# Patient Record
Sex: Female | Born: 1975
Health system: Southern US, Community
[De-identification: ages and names within clinical notes are randomized; demographics above are authoritative.]

## PROBLEM LIST (undated history)

## (undated) DIAGNOSIS — S32049A Unspecified fracture of fourth lumbar vertebra, initial encounter for closed fracture: Secondary | ICD-10-CM

## (undated) DIAGNOSIS — K2970 Gastritis, unspecified, without bleeding: Secondary | ICD-10-CM

## (undated) DIAGNOSIS — C50919 Malignant neoplasm of unspecified site of unspecified female breast: Secondary | ICD-10-CM

## (undated) DIAGNOSIS — C801 Malignant (primary) neoplasm, unspecified: Secondary | ICD-10-CM

## (undated) DIAGNOSIS — L709 Acne, unspecified: Secondary | ICD-10-CM

## (undated) DIAGNOSIS — D497 Neoplasm of unspecified behavior of endocrine glands and other parts of nervous system: Secondary | ICD-10-CM

## (undated) DIAGNOSIS — Z86018 Personal history of other benign neoplasm: Secondary | ICD-10-CM

## (undated) HISTORY — PX: AUGMENTATION MAMMAPLASTY: SUR837

## (undated) HISTORY — DX: Gastritis, unspecified, without bleeding: K29.70

## (undated) HISTORY — DX: Personal history of other benign neoplasm: Z86.018

## (undated) HISTORY — DX: Acne, unspecified: L70.9

## (undated) HISTORY — PX: MASTECTOMY: SHX3

## (undated) HISTORY — PX: TONSILLECTOMY: SUR1361

## (undated) HISTORY — DX: Unspecified fracture of fourth lumbar vertebra, initial encounter for closed fracture: S32.049A

## (undated) HISTORY — DX: Neoplasm of unspecified behavior of endocrine glands and other parts of nervous system: D49.7

---

## 1999-05-01 ENCOUNTER — Other Ambulatory Visit: Admission: RE | Admit: 1999-05-01 | Discharge: 1999-05-01 | Payer: Self-pay | Admitting: Obstetrics & Gynecology

## 1999-11-12 ENCOUNTER — Inpatient Hospital Stay (HOSPITAL_COMMUNITY): Admission: AD | Admit: 1999-11-12 | Discharge: 1999-11-12 | Payer: Self-pay | Admitting: Obstetrics and Gynecology

## 2000-01-09 ENCOUNTER — Inpatient Hospital Stay (HOSPITAL_COMMUNITY): Admission: AD | Admit: 2000-01-09 | Discharge: 2000-01-11 | Payer: Self-pay | Admitting: Obstetrics & Gynecology

## 2000-02-05 ENCOUNTER — Other Ambulatory Visit: Admission: RE | Admit: 2000-02-05 | Discharge: 2000-02-05 | Payer: Self-pay | Admitting: Obstetrics & Gynecology

## 2001-02-17 ENCOUNTER — Other Ambulatory Visit: Admission: RE | Admit: 2001-02-17 | Discharge: 2001-02-17 | Payer: Self-pay | Admitting: Obstetrics & Gynecology

## 2002-03-17 ENCOUNTER — Other Ambulatory Visit: Admission: RE | Admit: 2002-03-17 | Discharge: 2002-03-17 | Payer: Self-pay | Admitting: Obstetrics & Gynecology

## 2002-10-05 ENCOUNTER — Inpatient Hospital Stay (HOSPITAL_COMMUNITY): Admission: AD | Admit: 2002-10-05 | Discharge: 2002-10-05 | Payer: Self-pay | Admitting: Obstetrics & Gynecology

## 2002-11-25 ENCOUNTER — Inpatient Hospital Stay (HOSPITAL_COMMUNITY): Admission: AD | Admit: 2002-11-25 | Discharge: 2002-11-27 | Payer: Self-pay | Admitting: Obstetrics and Gynecology

## 2002-12-28 ENCOUNTER — Other Ambulatory Visit: Admission: RE | Admit: 2002-12-28 | Discharge: 2002-12-28 | Payer: Self-pay | Admitting: Family Medicine

## 2004-03-14 ENCOUNTER — Other Ambulatory Visit: Admission: RE | Admit: 2004-03-14 | Discharge: 2004-03-14 | Payer: Self-pay | Admitting: Obstetrics & Gynecology

## 2005-03-27 ENCOUNTER — Other Ambulatory Visit: Admission: RE | Admit: 2005-03-27 | Discharge: 2005-03-27 | Payer: Self-pay | Admitting: Obstetrics & Gynecology

## 2006-03-02 DIAGNOSIS — D497 Neoplasm of unspecified behavior of endocrine glands and other parts of nervous system: Secondary | ICD-10-CM

## 2006-03-02 HISTORY — DX: Neoplasm of unspecified behavior of endocrine glands and other parts of nervous system: D49.7

## 2006-11-01 DIAGNOSIS — K2970 Gastritis, unspecified, without bleeding: Secondary | ICD-10-CM

## 2006-11-01 HISTORY — DX: Gastritis, unspecified, without bleeding: K29.70

## 2007-01-20 ENCOUNTER — Ambulatory Visit: Payer: Self-pay | Admitting: Cardiovascular Disease

## 2007-09-07 ENCOUNTER — Ambulatory Visit: Payer: Self-pay | Admitting: Unknown Physician Specialty

## 2007-10-03 ENCOUNTER — Ambulatory Visit: Payer: Self-pay | Admitting: Gastroenterology

## 2008-09-30 ENCOUNTER — Ambulatory Visit: Payer: Self-pay | Admitting: Internal Medicine

## 2009-03-27 ENCOUNTER — Encounter: Payer: Self-pay | Admitting: Orthopedic Surgery

## 2009-04-02 ENCOUNTER — Encounter: Payer: Self-pay | Admitting: Orthopedic Surgery

## 2009-04-30 ENCOUNTER — Encounter: Payer: Self-pay | Admitting: Orthopedic Surgery

## 2010-03-02 HISTORY — PX: ENDOMETRIAL ABLATION: SHX621

## 2010-06-29 ENCOUNTER — Ambulatory Visit: Payer: Self-pay | Admitting: Family Medicine

## 2010-12-17 ENCOUNTER — Other Ambulatory Visit: Payer: Self-pay | Admitting: Internal Medicine

## 2011-01-05 ENCOUNTER — Encounter: Payer: Self-pay | Admitting: Internal Medicine

## 2011-01-06 ENCOUNTER — Encounter: Payer: Self-pay | Admitting: Internal Medicine

## 2011-01-06 ENCOUNTER — Ambulatory Visit (INDEPENDENT_AMBULATORY_CARE_PROVIDER_SITE_OTHER): Payer: Managed Care, Other (non HMO) | Admitting: Internal Medicine

## 2011-01-06 DIAGNOSIS — E538 Deficiency of other specified B group vitamins: Secondary | ICD-10-CM

## 2011-01-06 DIAGNOSIS — Z124 Encounter for screening for malignant neoplasm of cervix: Secondary | ICD-10-CM

## 2011-01-06 DIAGNOSIS — D497 Neoplasm of unspecified behavior of endocrine glands and other parts of nervous system: Secondary | ICD-10-CM | POA: Insufficient documentation

## 2011-01-06 DIAGNOSIS — M940 Chondrocostal junction syndrome [Tietze]: Secondary | ICD-10-CM

## 2011-01-06 MED ORDER — CYANOCOBALAMIN 1000 MCG/ML IJ SOLN: 1000.0000 ug | INTRAMUSCULAR | Status: DC

## 2011-01-07 ENCOUNTER — Encounter: Payer: Self-pay | Admitting: Internal Medicine

## 2011-01-07 DIAGNOSIS — K2970 Gastritis, unspecified, without bleeding: Secondary | ICD-10-CM | POA: Insufficient documentation

## 2011-01-07 DIAGNOSIS — R079 Chest pain, unspecified: Secondary | ICD-10-CM | POA: Insufficient documentation

## 2011-01-07 NOTE — Progress Notes (Signed)
  Subjective:    Patient ID: Robin Riley, female    DOB: 1975/11/27, 35 y.o.   MRN: [id number]  HPI  35 yo healthy female here for her annual exam.  She has no new complaints and had her PAP smear done recently by Dr. Jennette Kettle.     Review of Systems     Objective:   Physical Exam        Assessment & Plan:   Subjective:     Robin Riley is a 35 y.o. female and is here for a comprehensive physical exam. The patient reports no problems.  History   Social History  . Marital Status: Married    Spouse Name: N/A    Number of Children: N/A  . Years of Education: N/A   Occupational History  . Not on file.   Social History Main Topics  . Smoking status: Never Smoker   . Smokeless tobacco: Never Used  . Alcohol Use: No  . Drug Use: No  . Sexually Active: Not on file   Other Topics Concern  . Not on file   Social History Narrative  . No narrative on file   Health Maintenance  Topic Date Due  . Pap Smear  12/09/1991  . Tetanus/tdap  12/08/1992  . Influenza Vaccine  12/01/2010    The following portions of the patient's history were reviewed and updated as appropriate: allergies, current medications, past family history, past medical history, past social history, past surgical history and problem list.  Review of Systems A comprehensive review of systems was negative.   Objective:    BP 128/72  Pulse 79  Temp(Src) 98.6 F (37 C) (Oral)  Resp 16  Ht 5' 3.5" (1.613 m)  Wt 137 lb 4 oz (62.256 kg)  BMI 23.93 kg/m2  SpO2 100%  LMP 12/21/2010 General appearance: alert, cooperative and appears stated age Head: Normocephalic, without obvious abnormality, atraumatic Eyes: conjunctivae/corneas clear. PERRL, EOM's intact. Fundi benign. Ears: normal TM's and external ear canals both ears Nose: Nares normal. Septum midline. Mucosa normal. No drainage or sinus tenderness. Throat: lips, mucosa, and tongue normal; teeth and gums normal Neck: no adenopathy, no carotid  bruit, no JVD, supple, symmetrical, trachea midline and thyroid not enlarged, symmetric, no tenderness/mass/nodules Back: symmetric, no curvature. ROM normal. No CVA tenderness. Lungs: clear to auscultation bilaterally Heart: regular rate and rhythm, S1, S2 normal, no murmur, click, rub or gallop Abdomen: soft, non-tender; bowel sounds normal; no masses,  no organomegaly Extremities: extremities normal, atraumatic, no cyanosis or edema Pulses: 2+ and symmetric Skin: Skin color, texture, turgor normal. No rashes or lesions Neurologic: Alert and oriented X 3, normal strength and tone. Normal symmetric reflexes. Normal coordination and gait Cranial nerves: normal Sensory: normal Motor: grossly normal Reflexes: 2+ and symmetric Coordination: normal    Assessment:    Healthy female exam. PAP smear and breast exam were done by D. Neal in August.   She has no hsitory of hyperlipidemia, diabetes, anemia or hypertension.    Plan:    Follow up in one year.  See After Visit Summary for Counseling Recommendations

## 2012-01-05 ENCOUNTER — Ambulatory Visit (INDEPENDENT_AMBULATORY_CARE_PROVIDER_SITE_OTHER): Payer: Managed Care, Other (non HMO) | Admitting: Internal Medicine

## 2012-01-05 ENCOUNTER — Encounter: Payer: Self-pay | Admitting: Internal Medicine

## 2012-01-05 VITALS — BP 110/64 | HR 72 | Temp 97.9°F | Ht 63.5 in | Wt 135.2 lb

## 2012-01-05 DIAGNOSIS — Z8781 Personal history of (healed) traumatic fracture: Secondary | ICD-10-CM | POA: Insufficient documentation

## 2012-01-05 DIAGNOSIS — E559 Vitamin D deficiency, unspecified: Secondary | ICD-10-CM

## 2012-01-05 DIAGNOSIS — IMO0002 Reserved for concepts with insufficient information to code with codable children: Secondary | ICD-10-CM

## 2012-01-05 DIAGNOSIS — Z0000 Encounter for general adult medical examination without abnormal findings: Secondary | ICD-10-CM

## 2012-01-05 DIAGNOSIS — R5381 Other malaise: Secondary | ICD-10-CM

## 2012-01-05 DIAGNOSIS — E538 Deficiency of other specified B group vitamins: Secondary | ICD-10-CM | POA: Insufficient documentation

## 2012-01-05 DIAGNOSIS — Z13220 Encounter for screening for lipoid disorders: Secondary | ICD-10-CM

## 2012-01-05 DIAGNOSIS — R5383 Other fatigue: Secondary | ICD-10-CM

## 2012-01-05 LAB — COMPREHENSIVE METABOLIC PANEL
AST: 20 U/L (ref 0–37)
Albumin: 4.2 g/dL (ref 3.5–5.2)
Alkaline Phosphatase: 51 U/L (ref 39–117)
BUN: 8 mg/dL (ref 6–23)
Glucose, Bld: 81 mg/dL (ref 70–99)
Potassium: 4.2 mEq/L (ref 3.5–5.1)
Sodium: 139 mEq/L (ref 135–145)
Total Bilirubin: 0.5 mg/dL (ref 0.3–1.2)
Total Protein: 7.2 g/dL (ref 6.0–8.3)

## 2012-01-05 LAB — LIPID PANEL
HDL: 45.8 mg/dL (ref >39.00)
LDL Cholesterol: 90 mg/dL (ref 0–99)
VLDL: 8.4 mg/dL (ref 0.0–40.0)

## 2012-01-05 LAB — TSH: TSH: 0.89 u[IU]/mL (ref 0.35–5.50)

## 2012-01-05 NOTE — Patient Instructions (Signed)
You can take up to 800 mg of ibuprofen  every 8 hours for brief periods o

## 2012-01-05 NOTE — Progress Notes (Signed)
Patient ID: Robin Riley, female   DOB: [birth year], 36 y.o.   MRN: [id number]  Subjective:     Robin Riley is a 36 y.o. female and is here for a comprehensive physical exam. Does not need PAP.  The patient reports that she continues to have chest pain , not daily ,  Finished a Z pack yesterday for sinus infection.  And had recurrence of costochondritis. Treats it with ibuprofen.  Occurs almost weekly.  Taking dexilant to protect stomach. Has had a reaction to celebrex in the past due to sulfur allergy.  She also has occasional low back pain aggravated by prlonged walking, this weekend.  Has history of vertebral fracture, lumbar, seen by Dr. Loistine Simas at [facility] .  Relieved with [facility], chiropractor.     History   Social History  . Marital Status: Married    Spouse Name: N/A    Number of Children: N/A  . Years of Education: N/A   Occupational History  . Not on file.   Social History Main Topics  . Smoking status: Never Smoker   . Smokeless tobacco: Never Used  . Alcohol Use: No  . Drug Use: No  . Sexually Active: Not on file   Other Topics Concern  . Not on file   Social History Narrative  . No narrative on file   Health Maintenance  Topic Date Due  . Tetanus/tdap  12/08/1992  . Influenza Vaccine  11/01/2011  . Pap Smear  07/05/2014    The following portions of the patient's history were reviewed and updated as appropriate: allergies, current medications, past family history, past medical history, past social history, past surgical history and problem list.  Review of Systems A comprehensive review of systems was negative.   Objective:   BP 110/64  Pulse 72  Temp 97.9 F (36.6 C) (Oral)  Ht 5' 3.5" (1.613 m)  Wt 135 lb 4 oz (61.349 kg)  BMI 23.58 kg/m2  SpO2 98%  LMP 12/05/2011  General appearance: alert, cooperative and appears stated age Ears: normal TM's and external ear canals both ears Throat: lips, mucosa, and tongue normal; teeth and gums normal Neck: no  adenopathy, no carotid bruit, supple, symmetrical, trachea midline and thyroid not enlarged, symmetric, no tenderness/mass/nodules Back: symmetric, no curvature. ROM normal. No CVA tenderness. Lungs: clear to auscultation bilaterally Breasts: breasts appear normal, no suspicious masses, no skin or nipple changes or axillary nodes. Heart: regular rate and rhythm, S1, S2 normal, no murmur, click, rub or gallop Abdomen: soft, non-tender; bowel sounds normal; no masses,  no organomegaly Pulses: 2+ and symmetric Skin: Skin color, texture, turgor normal. No rashes or lesions Lymph nodes: Cervical, supraclavicular, and axillary nodes normal.      Assessment:   Routine general medical examination at a health care facility Breast exam without pelvic was done and normal.    Updated Medication List Outpatient Encounter Prescriptions as of 01/05/2012  Medication Sig Dispense Refill  . calcium citrate-vitamin D 200-200 MG-UNIT TABS Take 1 tablet by mouth daily.      . cyanocobalamin (,VITAMIN B-12,) 1000 MCG/ML injection Inject 1 mL (1,000 mcg total) into the muscle every 30 (thirty) days.  12 mL  1  . dexlansoprazole (DEXILANT) 60 MG capsule Take 60 mg by mouth daily.        . fish oil-omega-3 fatty acids 1000 MG capsule Take 2 g by mouth daily.        Marland Kitchen ibuprofen (ADVIL,MOTRIN) 200 MG tablet Take 200 mg  by mouth as needed.        . Multiple Vitamin (MULTIVITAMIN) tablet Take 1 tablet by mouth daily.         .frm.

## 2012-01-06 ENCOUNTER — Telehealth: Payer: Self-pay | Admitting: Internal Medicine

## 2012-01-06 LAB — VITAMIN D 25 HYDROXY (VIT D DEFICIENCY, FRACTURES): Vit D, 25-Hydroxy: 40 ng/mL (ref 30–89)

## 2012-01-06 MED ORDER — MELOXICAM 15 MG PO TABS: 15.0000 mg | ORAL_TABLET | Freq: Every day | ORAL | Status: DC

## 2012-01-06 NOTE — Telephone Encounter (Signed)
Pt said her and Dr. Darrick Huntsman discussed trying Meloxicam and she was wanting to know if that could be called in for her to try. [facility] uses [facility] in [place]

## 2012-01-06 NOTE — Telephone Encounter (Signed)
30 day supply sent in,  It it helps, call for refills.

## 2012-01-07 ENCOUNTER — Telehealth: Payer: Self-pay | Admitting: Internal Medicine

## 2012-01-07 NOTE — Telephone Encounter (Signed)
Spoke to patient via phone gave her normal lab results and advised Rx melaxicam was [facility] to [facility]

## 2012-01-07 NOTE — Telephone Encounter (Signed)
Her cholesterol thyroid and vitamin D level were all fine.

## 2012-01-10 DIAGNOSIS — Z0000 Encounter for general adult medical examination without abnormal findings: Secondary | ICD-10-CM | POA: Insufficient documentation

## 2012-01-10 NOTE — Assessment & Plan Note (Signed)
Breast exam without pelvic was done and normal.

## 2012-01-13 ENCOUNTER — Other Ambulatory Visit: Payer: Self-pay

## 2012-01-13 DIAGNOSIS — E538 Deficiency of other specified B group vitamins: Secondary | ICD-10-CM

## 2012-01-13 NOTE — Telephone Encounter (Signed)
Refill request for Cyanocobalamin 1000 mcg ok to refill ?

## 2012-01-15 ENCOUNTER — Other Ambulatory Visit: Payer: Self-pay

## 2012-01-15 DIAGNOSIS — E538 Deficiency of other specified B group vitamins: Secondary | ICD-10-CM

## 2012-01-15 MED ORDER — CYANOCOBALAMIN 1000 MCG/ML IJ SOLN: 1000.0000 ug | INTRAMUSCULAR | Status: DC

## 2012-01-15 NOTE — Telephone Encounter (Signed)
Refill request for B-12.ok to refill?

## 2012-01-15 NOTE — Telephone Encounter (Signed)
Ok to refill,  Authorized in epic 

## 2012-01-15 NOTE — Telephone Encounter (Signed)
Cyanocobalamin 1000 mcg sent electronic to [facility]

## 2012-02-07 ENCOUNTER — Other Ambulatory Visit: Payer: Self-pay | Admitting: Internal Medicine

## 2012-03-25 ENCOUNTER — Telehealth: Payer: Self-pay | Admitting: Internal Medicine

## 2012-03-25 ENCOUNTER — Other Ambulatory Visit: Payer: Self-pay | Admitting: Internal Medicine

## 2012-03-25 NOTE — Telephone Encounter (Signed)
Pt called she needs copy of her immunization records.  Pt needs to pick this up on Monday

## 2012-03-25 NOTE — Telephone Encounter (Signed)
Med filled.  

## 2012-03-28 ENCOUNTER — Telehealth: Payer: Self-pay | Admitting: Internal Medicine

## 2012-03-28 NOTE — Telephone Encounter (Signed)
Pt notified that we do not have immunization record on file.

## 2012-03-28 NOTE — Telephone Encounter (Signed)
Pt states she needs copy of immunization records. Pt states she has left two messages with the nurse and has not heard back yet.  Pt has appt 8am 1/28 and needs the records for the appt.

## 2012-03-28 NOTE — Telephone Encounter (Signed)
We do not have any immunizations on file.

## 2012-06-09 ENCOUNTER — Other Ambulatory Visit: Payer: Self-pay | Admitting: Internal Medicine

## 2012-06-10 NOTE — Telephone Encounter (Signed)
Med filled on 4/10

## 2012-11-07 LAB — HM PAP SMEAR: HM Pap smear: NORMAL

## 2013-01-03 ENCOUNTER — Ambulatory Visit: Payer: Self-pay | Admitting: Internal Medicine

## 2013-01-05 ENCOUNTER — Other Ambulatory Visit: Payer: Self-pay

## 2013-01-05 ENCOUNTER — Encounter: Payer: Managed Care, Other (non HMO) | Admitting: Internal Medicine

## 2013-01-05 ENCOUNTER — Encounter: Payer: Self-pay | Admitting: Internal Medicine

## 2013-01-05 ENCOUNTER — Ambulatory Visit (INDEPENDENT_AMBULATORY_CARE_PROVIDER_SITE_OTHER): Payer: Managed Care, Other (non HMO) | Admitting: Internal Medicine

## 2013-01-05 VITALS — BP 110/84 | HR 82 | Temp 98.2°F | Ht 63.5 in | Wt 141.8 lb

## 2013-01-05 DIAGNOSIS — IMO0002 Reserved for concepts with insufficient information to code with codable children: Secondary | ICD-10-CM

## 2013-01-05 DIAGNOSIS — Z124 Encounter for screening for malignant neoplasm of cervix: Secondary | ICD-10-CM

## 2013-01-05 DIAGNOSIS — Z0000 Encounter for general adult medical examination without abnormal findings: Secondary | ICD-10-CM

## 2013-01-05 DIAGNOSIS — M940 Chondrocostal junction syndrome [Tietze]: Secondary | ICD-10-CM

## 2013-01-05 DIAGNOSIS — E559 Vitamin D deficiency, unspecified: Secondary | ICD-10-CM

## 2013-01-05 DIAGNOSIS — S32049S Unspecified fracture of fourth lumbar vertebra, sequela: Secondary | ICD-10-CM

## 2013-01-05 DIAGNOSIS — E785 Hyperlipidemia, unspecified: Secondary | ICD-10-CM

## 2013-01-05 DIAGNOSIS — R5381 Other malaise: Secondary | ICD-10-CM

## 2013-01-05 DIAGNOSIS — Z8262 Family history of osteoporosis: Secondary | ICD-10-CM

## 2013-01-05 LAB — CBC WITH DIFFERENTIAL/PLATELET
Basophils Absolute: 0 10*3/uL (ref 0.0–0.1)
Eosinophils Absolute: 0 10*3/uL (ref 0.0–0.7)
HCT: 37.3 % (ref 36.0–46.0)
Lymphs Abs: 1.5 10*3/uL (ref 0.7–4.0)
MCHC: 32.9 g/dL (ref 30.0–36.0)
MCV: 83.1 fl (ref 78.0–100.0)
Monocytes Absolute: 0.3 10*3/uL (ref 0.1–1.0)
Neutrophils Relative %: 61.5 % (ref 43.0–77.0)
Platelets: 200 10*3/uL (ref 150.0–400.0)
RBC: 4.49 Mil/uL (ref 3.87–5.11)
RDW: 13.5 % (ref 11.5–14.6)

## 2013-01-05 LAB — TSH: TSH: 0.71 u[IU]/mL (ref 0.35–5.50)

## 2013-01-05 LAB — COMPREHENSIVE METABOLIC PANEL
ALT: 20 U/L (ref 0–35)
AST: 20 U/L (ref 0–37)
Alkaline Phosphatase: 51 U/L (ref 39–117)
Sodium: 139 mEq/L (ref 135–145)
Total Bilirubin: 0.5 mg/dL (ref 0.3–1.2)
Total Protein: 7.1 g/dL (ref 6.0–8.3)

## 2013-01-05 LAB — LIPID PANEL: VLDL: 6.2 mg/dL (ref 0.0–40.0)

## 2013-01-05 NOTE — Patient Instructions (Signed)
DEXA scan to be ordered  PT consultation for back pain

## 2013-01-05 NOTE — Progress Notes (Signed)
Patient ID: Robin Riley, female   DOB: [birth year], 37 y.o.   MRN: [id number]  Subjective:     Robin Riley is a 37 y.o. female here for a routine exam.  Current complaints: as follows . She has had a recent flare of costochondritis , and has managed it with ibuprofen.  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    ,  Back pain is still present by managed by [employer].  She was treated with abx by ENT 2 months ago for sinusitis .  Her symptoms resolved and she had no antibiotic associated diarrhea .  Personal health questionnaire reviewed: yes.   Gynecologic History No LMP recorded. Contraception: tubal ligation and endometrial ablation and NovaSure Last Pap: Sept 2014. Results were: normal Last mammogram: at age 82. Results were: normal  Obstetric History OB History  No data available     The following portions of the patient's history were reviewed and updated as appropriate: allergies, current medications, past family history, past medical history, past social history, past surgical history and problem list.  Review of Systems A comprehensive review of systems was negative except for: Cardiovascular: positive for chest pain    Objective:   General Appearance:    Alert, cooperative, no distress, appears stated age  Head:    Normocephalic, without obvious abnormality, atraumatic  Eyes:    PERRL, conjunctiva/corneas clear, EOM's intact, fundi    benign, both eyes  Ears:    Normal TM's and external ear canals, both ears  Nose:   Nares normal, septum midline, mucosa normal, no drainage    or sinus tenderness  Throat:   Lips, mucosa, and tongue normal; teeth and gums normal  Neck:   Supple, symmetrical, trachea midline, no adenopathy;    thyroid:  no enlargement/tenderness/nodules; no carotid   bruit or JVD  Back:     Symmetric, no curvature, ROM normal, no CVA tenderness  Lungs:     Clear to auscultation bilaterally, respirations  unlabored  Chest Wall:    No tenderness or deformity   Heart:    Regular rate and rhythm, S1 and S2 normal, no murmur, rub   or gallop     Abdomen:     Soft, non-tender, bowel sounds active all four quadrants,    no masses, no organomegaly     Extremities:   Extremities normal, atraumatic, no cyanosis or edema  Pulses:   2+ and symmetric all extremities  Skin:   Skin color, texture, turgor normal, no rashes or lesions  Lymph nodes:   Cervical, supraclavicular, and axillary nodes normal  Neurologic:   CNII-XII intact, normal strength, sensation and reflexes    throughout    Assessment and Plan:  History of vertebral fracture Non traumatic, reportedly,  With no priro workup for osteoporosis.,  NO prior DEXA.  Ordered,  Along with Vit d which was low normal  Routine general medical examination at a health care facility Annual comprehensive exam was done excluding breast, pelvic and PAP smear. All screenings have been addressed .   Costochondritis Recurrent .  Managed with ibuprofen,  prior cardiologic workup noted.   Screening for cervical cancer Managed by Dr. Druscilla Brownie in [place] with annual PAP smears.    Updated Medication List Outpatient Encounter Prescriptions as of 01/05/2013  Medication Sig  . calcium citrate-vitamin D 200-200 MG-UNIT TABS Take 1 tablet by mouth daily.  . cyanocobalamin (,VITAMIN B-12,) 1000 MCG/ML injection Inject 1  mL (1,000 mcg total) into the muscle every 30 (thirty) days.  Marland Kitchen dexlansoprazole (DEXILANT) 60 MG capsule Take 60 mg by mouth daily.    . fish oil-omega-3 fatty acids 1000 MG capsule Take 2 g by mouth daily.    Marland Kitchen ibuprofen (ADVIL,MOTRIN) 200 MG tablet Take 200 mg by mouth as needed.    . meloxicam (MOBIC) 15 MG tablet TAKE 1 TABLET (15 MG TOTAL) BY MOUTH DAILY.  . Multiple Vitamin (MULTIVITAMIN) tablet Take 1 tablet by mouth daily.

## 2013-01-05 NOTE — Progress Notes (Signed)
Pre visit review using our clinic review tool, if applicable. No additional management support is needed unless otherwise documented below in the visit note. 

## 2013-01-07 NOTE — Assessment & Plan Note (Signed)
Non traumatic, reportedly,  With no priro workup for osteoporosis.,  NO prior DEXA.  Ordered,  Along with Vit d which was low normal

## 2013-01-07 NOTE — Assessment & Plan Note (Signed)
Recurrent .  Managed with ibuprofen,  prior cardiologic workup noted.

## 2013-01-07 NOTE — Assessment & Plan Note (Signed)
Annual comprehensive exam was done excluding breast, pelvic and PAP smear. All screenings have been addressed .  

## 2013-01-07 NOTE — Assessment & Plan Note (Signed)
Managed by Dr. Druscilla Brownie in [place] with annual PAP smears.

## 2013-01-10 ENCOUNTER — Encounter: Payer: Managed Care, Other (non HMO) | Admitting: Internal Medicine

## 2013-04-26 ENCOUNTER — Encounter: Payer: Self-pay | Admitting: Cardiovascular Disease

## 2014-01-12 ENCOUNTER — Other Ambulatory Visit: Payer: Self-pay | Admitting: Obstetrics & Gynecology

## 2014-01-12 DIAGNOSIS — R928 Other abnormal and inconclusive findings on diagnostic imaging of breast: Secondary | ICD-10-CM

## 2014-01-18 ENCOUNTER — Ambulatory Visit (INDEPENDENT_AMBULATORY_CARE_PROVIDER_SITE_OTHER): Payer: Managed Care, Other (non HMO) | Admitting: Internal Medicine

## 2014-01-18 ENCOUNTER — Encounter: Payer: Self-pay | Admitting: Internal Medicine

## 2014-01-18 VITALS — BP 104/70 | HR 79 | Temp 98.3°F | Resp 16 | Ht 63.0 in | Wt 141.8 lb

## 2014-01-18 DIAGNOSIS — E538 Deficiency of other specified B group vitamins: Secondary | ICD-10-CM

## 2014-01-18 DIAGNOSIS — M940 Chondrocostal junction syndrome [Tietze]: Secondary | ICD-10-CM

## 2014-01-18 DIAGNOSIS — Z0000 Encounter for general adult medical examination without abnormal findings: Secondary | ICD-10-CM

## 2014-01-18 MED ORDER — SYRINGE (DISPOSABLE) 1 ML MISC: Status: DC

## 2014-01-18 MED ORDER — CYANOCOBALAMIN 1000 MCG/ML IJ SOLN: 1000.0000 ug | INTRAMUSCULAR | Status: DC

## 2014-01-18 NOTE — Patient Instructions (Signed)

## 2014-01-18 NOTE — Progress Notes (Signed)
Pre-visit discussion using our clinic review tool. No additional management support is needed unless otherwise documented below in the visit note.  

## 2014-01-18 NOTE — Progress Notes (Signed)
Patient ID: Robin Riley, female   DOB: February 10, 1976, 38 y.o.   MRN: [id number]  Subjective:     Robin Riley is a 38 y.o. female and is here for a comprehensive physical exam. The patient reports no problems.  History   Social History  . Marital Status: Married    Spouse Name: N/A    Number of Children: N/A  . Years of Education: N/A   Occupational History  . Not on file.   Social History Main Topics  . Smoking status: Never Smoker   . Smokeless tobacco: Never Used  . Alcohol Use: No  . Drug Use: No  . Sexual Activity: Not on file   Other Topics Concern  . Not on file   Social History Narrative   Health Maintenance  Topic Date Due  . Samul Dada  12/08/1992  . INFLUENZA VACCINE  09/30/2013  . PAP SMEAR  11/08/2015    The following portions of the patient's history were reviewed and updated as appropriate: allergies, current medications, past family history, past medical history, past social history, past surgical history and problem list.  Review of Systems A comprehensive review of systems was negative.   Objective:   BP 104/70 mmHg  Pulse 79  Temp(Src) 98.3 F (36.8 C) (Oral)  Resp 16  Ht 5\' 3"  (1.6 m)  Wt 141 lb 12 oz (64.297 kg)  BMI 25.12 kg/m2  SpO2 99%  LMP 01/12/2014 (Approximate)  General appearance: alert, cooperative and appears stated age Head: Normocephalic, without obvious abnormality, atraumatic Eyes: conjunctivae/corneas clear. PERRL, EOM's intact. Fundi benign. Ears: normal TM's and external ear canals both ears Nose: Nares normal. Septum midline. Mucosa normal. No drainage or sinus tenderness. Throat: lips, mucosa, and tongue normal; teeth and gums normal Neck: no adenopathy, no carotid bruit, no JVD, supple, symmetrical, trachea midline and thyroid not enlarged, symmetric, no tenderness/mass/nodules Lungs: clear to auscultation bilaterally Breasts: normal appearance, no masses or tenderness Heart: regular rate and rhythm, S1, S2  normal, no murmur, click, rub or gallop Abdomen: soft, non-tender; bowel sounds normal; no masses,  no organomegaly Extremities: extremities normal, atraumatic, no cyanosis or edema Pulses: 2+ and symmetric Skin: Skin color, texture, turgor normal. No rashes or lesions Neurologic: Alert and oriented X 3, normal strength and tone. Normal symmetric reflexes. Normal coordination and gait.   .    Assessment and Plan:   Encounter for preventive health examination Annual wellness  exam was done as well as a comprehensive physical exam and management of acute and chronic conditions .  During the course of the visit the patient was educated and counseled about appropriate screening and preventive services including :  diabetes screening, lipid analysis with projected  10 year  risk for CAD , nutrition counseling, colorectal cancer screening, and recommended immunizations.  Printed recommendations for health maintenance screenings was given.   Costochondritis Recurrent,  Managed with prn ibuprofen.   Updated Medication List Outpatient Encounter Prescriptions as of 01/18/2014  Medication Sig  . calcium citrate-vitamin D 200-200 MG-UNIT TABS Take 1 tablet by mouth daily.  . cyanocobalamin (,VITAMIN B-12,) 1000 MCG/ML injection Inject 1 mL (1,000 mcg total) into the muscle every 30 (thirty) days.  Marland Kitchen dexlansoprazole (DEXILANT) 60 MG capsule Take 60 mg by mouth daily.    Marland Kitchen ibuprofen (ADVIL,MOTRIN) 200 MG tablet Take 200 mg by mouth as needed.    . Multiple Vitamin (MULTIVITAMIN) tablet Take 1 tablet by mouth daily.    . [DISCONTINUED] cyanocobalamin (,VITAMIN B-12,) 1000 MCG/ML  injection Inject 1 mL (1,000 mcg total) into the muscle every 30 (thirty) days.  . fish oil-omega-3 fatty acids 1000 MG capsule Take 2 g by mouth daily.    . meloxicam (MOBIC) 15 MG tablet TAKE 1 TABLET (15 MG TOTAL) BY MOUTH DAILY.  Marland Kitchen Syringe, Disposable, 1 ML MISC Use monthly for B12 injections

## 2014-01-21 NOTE — Assessment & Plan Note (Signed)
Recurrent,  Managed with prn ibuprofen.

## 2014-01-21 NOTE — Assessment & Plan Note (Signed)

## 2014-01-30 ENCOUNTER — Ambulatory Visit
Admission: RE | Admit: 2014-01-30 | Discharge: 2014-01-30 | Disposition: A | Discharge status: Home / Self Care | Payer: Managed Care, Other (non HMO) | Source: Ambulatory Visit | Attending: Obstetrics & Gynecology | Admitting: Obstetrics & Gynecology

## 2014-01-30 DIAGNOSIS — R928 Other abnormal and inconclusive findings on diagnostic imaging of breast: Secondary | ICD-10-CM

## 2014-04-09 ENCOUNTER — Encounter: Payer: Self-pay | Admitting: Internal Medicine

## 2014-04-09 MED ORDER — DEXLANSOPRAZOLE 60 MG PO CPDR: 60.0000 mg | DELAYED_RELEASE_CAPSULE | Freq: Every day | ORAL | Status: DC

## 2014-05-29 ENCOUNTER — Other Ambulatory Visit: Payer: Self-pay | Admitting: Obstetrics & Gynecology

## 2014-05-29 DIAGNOSIS — R921 Mammographic calcification found on diagnostic imaging of breast: Secondary | ICD-10-CM

## 2014-08-01 ENCOUNTER — Other Ambulatory Visit: Payer: Self-pay | Admitting: Obstetrics & Gynecology

## 2014-08-01 ENCOUNTER — Ambulatory Visit
Admission: RE | Admit: 2014-08-01 | Discharge: 2014-08-01 | Disposition: A | Discharge status: Home / Self Care | Payer: BLUE CROSS/BLUE SHIELD | Source: Ambulatory Visit | Attending: Obstetrics & Gynecology | Admitting: Obstetrics & Gynecology

## 2014-08-01 DIAGNOSIS — R921 Mammographic calcification found on diagnostic imaging of breast: Secondary | ICD-10-CM

## 2014-08-08 ENCOUNTER — Ambulatory Visit
Admission: RE | Admit: 2014-08-08 | Discharge: 2014-08-08 | Disposition: A | Discharge status: Home / Self Care | Payer: BLUE CROSS/BLUE SHIELD | Source: Ambulatory Visit | Attending: Obstetrics & Gynecology | Admitting: Obstetrics & Gynecology

## 2014-08-08 DIAGNOSIS — R921 Mammographic calcification found on diagnostic imaging of breast: Secondary | ICD-10-CM

## 2014-08-09 ENCOUNTER — Other Ambulatory Visit: Payer: Self-pay | Admitting: Obstetrics & Gynecology

## 2014-08-09 DIAGNOSIS — R921 Mammographic calcification found on diagnostic imaging of breast: Secondary | ICD-10-CM

## 2014-08-15 ENCOUNTER — Ambulatory Visit
Admission: RE | Admit: 2014-08-15 | Discharge: 2014-08-15 | Disposition: A | Discharge status: Home / Self Care | Payer: BLUE CROSS/BLUE SHIELD | Source: Ambulatory Visit | Attending: Obstetrics & Gynecology | Admitting: Obstetrics & Gynecology

## 2014-08-15 DIAGNOSIS — R921 Mammographic calcification found on diagnostic imaging of breast: Secondary | ICD-10-CM

## 2014-08-29 ENCOUNTER — Other Ambulatory Visit: Payer: Self-pay | Admitting: General Surgery

## 2014-08-29 DIAGNOSIS — N6092 Unspecified benign mammary dysplasia of left breast: Secondary | ICD-10-CM

## 2014-08-31 ENCOUNTER — Other Ambulatory Visit: Payer: Self-pay | Admitting: General Surgery

## 2014-08-31 DIAGNOSIS — N6092 Unspecified benign mammary dysplasia of left breast: Secondary | ICD-10-CM

## 2014-09-27 ENCOUNTER — Encounter (HOSPITAL_BASED_OUTPATIENT_CLINIC_OR_DEPARTMENT_OTHER): Payer: Self-pay | Admitting: *Deleted

## 2014-10-01 ENCOUNTER — Ambulatory Visit
Admission: RE | Admit: 2014-10-01 | Discharge: 2014-10-01 | Disposition: A | Discharge status: Home / Self Care | Payer: BLUE CROSS/BLUE SHIELD | Source: Ambulatory Visit | Attending: General Surgery | Admitting: General Surgery

## 2014-10-01 DIAGNOSIS — N6092 Unspecified benign mammary dysplasia of left breast: Secondary | ICD-10-CM

## 2014-10-04 ENCOUNTER — Ambulatory Visit (HOSPITAL_BASED_OUTPATIENT_CLINIC_OR_DEPARTMENT_OTHER): Payer: BLUE CROSS/BLUE SHIELD | Admitting: Anesthesiology

## 2014-10-04 ENCOUNTER — Ambulatory Visit (HOSPITAL_BASED_OUTPATIENT_CLINIC_OR_DEPARTMENT_OTHER)
Admission: RE | Admit: 2014-10-04 | Discharge: 2014-10-04 | Disposition: A | Discharge status: Home / Self Care | Payer: BLUE CROSS/BLUE SHIELD | Source: Ambulatory Visit | Attending: General Surgery | Admitting: General Surgery

## 2014-10-04 ENCOUNTER — Encounter (HOSPITAL_BASED_OUTPATIENT_CLINIC_OR_DEPARTMENT_OTHER)
Admission: RE | Disposition: A | Discharge status: Home / Self Care | Payer: Self-pay | Source: Ambulatory Visit | Attending: General Surgery

## 2014-10-04 ENCOUNTER — Ambulatory Visit
Admission: RE | Admit: 2014-10-04 | Discharge: 2014-10-04 | Disposition: A | Discharge status: Home / Self Care | Payer: BLUE CROSS/BLUE SHIELD | Source: Ambulatory Visit | Attending: General Surgery | Admitting: General Surgery

## 2014-10-04 ENCOUNTER — Encounter (HOSPITAL_BASED_OUTPATIENT_CLINIC_OR_DEPARTMENT_OTHER): Payer: Self-pay | Admitting: *Deleted

## 2014-10-04 DIAGNOSIS — N6092 Unspecified benign mammary dysplasia of left breast: Secondary | ICD-10-CM | POA: Diagnosis present

## 2014-10-04 HISTORY — PX: BREAST LUMPECTOMY WITH RADIOACTIVE SEED LOCALIZATION: SHX6424

## 2014-10-04 SURGERY — BREAST LUMPECTOMY WITH RADIOACTIVE SEED LOCALIZATION
Anesthesia: General | Site: Breast | Laterality: Left

## 2014-10-04 MED ORDER — BUPIVACAINE HCL (PF) 0.25 % IJ SOLN
INTRAMUSCULAR | Status: DC | PRN
  Administered 2014-10-04: 13 mL

## 2014-10-04 MED ORDER — BUPIVACAINE HCL (PF) 0.25 % IJ SOLN
INTRAMUSCULAR | Status: AC
  Filled 2014-10-04: qty 30

## 2014-10-04 MED ORDER — FENTANYL CITRATE (PF) 100 MCG/2ML IJ SOLN
INTRAMUSCULAR | Status: AC
  Filled 2014-10-04: qty 6

## 2014-10-04 MED ORDER — PROPOFOL 10 MG/ML IV BOLUS
INTRAVENOUS | Status: DC | PRN
  Administered 2014-10-04: 200 mg via INTRAVENOUS

## 2014-10-04 MED ORDER — MIDAZOLAM HCL 2 MG/2ML IJ SOLN
INTRAMUSCULAR | Status: AC
  Filled 2014-10-04: qty 2

## 2014-10-04 MED ORDER — CEFAZOLIN SODIUM-DEXTROSE 2-3 GM-% IV SOLR
INTRAVENOUS | Status: AC
  Filled 2014-10-04: qty 50

## 2014-10-04 MED ORDER — GLYCOPYRROLATE 0.2 MG/ML IJ SOLN: 0.2000 mg | Freq: Once | INTRAMUSCULAR | Status: DC | PRN

## 2014-10-04 MED ORDER — OXYCODONE-ACETAMINOPHEN 5-325 MG PO TABS: 1.0000 | ORAL_TABLET | ORAL | Status: DC | PRN

## 2014-10-04 MED ORDER — LACTATED RINGERS IV SOLN
INTRAVENOUS | Status: DC
  Administered 2014-10-04 (×3): via INTRAVENOUS

## 2014-10-04 MED ORDER — OXYCODONE HCL 5 MG PO TABS: 5.0000 mg | ORAL_TABLET | Freq: Once | ORAL | Status: DC | PRN

## 2014-10-04 MED ORDER — SCOPOLAMINE 1 MG/3DAYS TD PT72
1.0000 | MEDICATED_PATCH | Freq: Once | TRANSDERMAL | Status: AC | PRN
  Administered 2014-10-04: 1.5 mg via TRANSDERMAL
  Administered 2014-10-04: 1 via TRANSDERMAL

## 2014-10-04 MED ORDER — CHLORHEXIDINE GLUCONATE 4 % EX LIQD: 1.0000 "application " | Freq: Once | CUTANEOUS | Status: DC

## 2014-10-04 MED ORDER — CEFAZOLIN SODIUM-DEXTROSE 2-3 GM-% IV SOLR
2.0000 g | INTRAVENOUS | Status: AC
  Administered 2014-10-04: 2 g via INTRAVENOUS

## 2014-10-04 MED ORDER — OXYCODONE HCL 5 MG/5ML PO SOLN: 5.0000 mg | Freq: Once | ORAL | Status: DC | PRN

## 2014-10-04 MED ORDER — MIDAZOLAM HCL 2 MG/2ML IJ SOLN
1.0000 mg | INTRAMUSCULAR | Status: DC | PRN
  Administered 2014-10-04: 2 mg via INTRAVENOUS

## 2014-10-04 MED ORDER — PROPOFOL 500 MG/50ML IV EMUL
INTRAVENOUS | Status: AC
  Filled 2014-10-04: qty 150

## 2014-10-04 MED ORDER — SUCCINYLCHOLINE CHLORIDE 20 MG/ML IJ SOLN
INTRAMUSCULAR | Status: AC
  Filled 2014-10-04: qty 2

## 2014-10-04 MED ORDER — MEPERIDINE HCL 25 MG/ML IJ SOLN: 6.2500 mg | INTRAMUSCULAR | Status: DC | PRN

## 2014-10-04 MED ORDER — DEXAMETHASONE SODIUM PHOSPHATE 4 MG/ML IJ SOLN
INTRAMUSCULAR | Status: DC | PRN
  Administered 2014-10-04: 10 mg via INTRAVENOUS

## 2014-10-04 MED ORDER — FENTANYL CITRATE (PF) 100 MCG/2ML IJ SOLN
50.0000 ug | INTRAMUSCULAR | Status: DC | PRN
  Administered 2014-10-04: 100 ug via INTRAVENOUS

## 2014-10-04 MED ORDER — LIDOCAINE HCL (CARDIAC) 20 MG/ML IV SOLN
INTRAVENOUS | Status: DC | PRN
  Administered 2014-10-04: 75 mg via INTRAVENOUS

## 2014-10-04 MED ORDER — ONDANSETRON HCL 4 MG/2ML IJ SOLN
INTRAMUSCULAR | Status: DC | PRN
  Administered 2014-10-04: 4 mg via INTRAVENOUS

## 2014-10-04 MED ORDER — HYDROMORPHONE HCL 1 MG/ML IJ SOLN: 0.2500 mg | INTRAMUSCULAR | Status: DC | PRN

## 2014-10-04 MED ORDER — SCOPOLAMINE 1 MG/3DAYS TD PT72
MEDICATED_PATCH | TRANSDERMAL | Status: AC
  Filled 2014-10-04: qty 1

## 2014-10-04 SURGICAL SUPPLY — 43 items
APPLIER CLIP 9.375 MED OPEN (MISCELLANEOUS)
APR CLP MED 9.3 20 MLT OPN (MISCELLANEOUS)
BLADE SURG 15 STRL LF DISP TIS (BLADE) ×1 IMPLANT
BLADE SURG 15 STRL SS (BLADE) ×3
CANISTER SUC SOCK COL 7IN (MISCELLANEOUS) IMPLANT
CANISTER SUCT 1200ML W/VALVE (MISCELLANEOUS) IMPLANT
CHLORAPREP W/TINT 26ML (MISCELLANEOUS) ×3 IMPLANT
CLIP APPLIE 9.375 MED OPEN (MISCELLANEOUS) IMPLANT
COVER BACK TABLE 60X90IN (DRAPES) ×3 IMPLANT
COVER MAYO STAND STRL (DRAPES) ×3 IMPLANT
COVER PROBE W GEL 5X96 (DRAPES) ×3 IMPLANT
DECANTER SPIKE VIAL GLASS SM (MISCELLANEOUS) IMPLANT
DEVICE DUBIN W/COMP PLATE 8390 (MISCELLANEOUS) ×3 IMPLANT
DRAPE LAPAROSCOPIC ABDOMINAL (DRAPES) IMPLANT
DRAPE LAPAROTOMY 100X72 PEDS (DRAPES) ×3 IMPLANT
DRAPE UTILITY XL STRL (DRAPES) ×3 IMPLANT
ELECT COATED BLADE 2.86 ST (ELECTRODE) ×3 IMPLANT
ELECT REM PT RETURN 9FT ADLT (ELECTROSURGICAL) ×3
ELECTRODE REM PT RTRN 9FT ADLT (ELECTROSURGICAL) ×1 IMPLANT
GLOVE BIO SURGEON STRL SZ7.5 (GLOVE) ×6 IMPLANT
GLOVE BIOGEL PI IND STRL 7.0 (GLOVE) ×1 IMPLANT
GLOVE BIOGEL PI INDICATOR 7.0 (GLOVE) ×2
GLOVE ECLIPSE 6.5 STRL STRAW (GLOVE) ×3 IMPLANT
GLOVE EXAM NITRILE EXT CUFF MD (GLOVE) ×3 IMPLANT
GOWN STRL REUS W/ TWL LRG LVL3 (GOWN DISPOSABLE) ×2 IMPLANT
GOWN STRL REUS W/TWL LRG LVL3 (GOWN DISPOSABLE) ×6
KIT MARKER MARGIN INK (KITS) ×3 IMPLANT
LIQUID BAND (GAUZE/BANDAGES/DRESSINGS) ×3 IMPLANT
NEEDLE HYPO 25X1 1.5 SAFETY (NEEDLE) ×3 IMPLANT
NS IRRIG 1000ML POUR BTL (IV SOLUTION) ×3 IMPLANT
PACK BASIN DAY SURGERY FS (CUSTOM PROCEDURE TRAY) ×3 IMPLANT
PENCIL BUTTON HOLSTER BLD 10FT (ELECTRODE) ×3 IMPLANT
SLEEVE SCD COMPRESS KNEE MED (MISCELLANEOUS) ×3 IMPLANT
SPONGE LAP 18X18 X RAY DECT (DISPOSABLE) ×3 IMPLANT
SUT MON AB 4-0 PC3 18 (SUTURE) ×3 IMPLANT
SUT SILK 2 0 SH (SUTURE) IMPLANT
SUT VICRYL 3-0 CR8 SH (SUTURE) ×3 IMPLANT
SYR CONTROL 10ML LL (SYRINGE) ×3 IMPLANT
TOWEL OR 17X24 6PK STRL BLUE (TOWEL DISPOSABLE) ×3 IMPLANT
TOWEL OR NON WOVEN STRL DISP B (DISPOSABLE) IMPLANT
TUBE CONNECTING 20'X1/4 (TUBING)
TUBE CONNECTING 20X1/4 (TUBING) IMPLANT
YANKAUER SUCT BULB TIP NO VENT (SUCTIONS) IMPLANT

## 2014-10-04 NOTE — Interval H&P Note (Signed)
History and Physical Interval Note:  10/04/2014 7:07 AM  Robin Riley  has presented today for surgery, with the diagnosis of Left Breast Atypical Duct Hyperplasia  The various methods of treatment have been discussed with the patient and family. After consideration of risks, benefits and other options for treatment, the patient has consented to  Procedure(s): LEFT BREAST LUMPECTOMY WITH RADIOACTIVE SEED LOCALIZATION (Left) as a surgical intervention .  The patient's history has been reviewed, patient examined, no change in status, stable for surgery.  I have reviewed the patient's chart and labs.  Questions were answered to the patient's satisfaction.     TOTH III,PAUL S

## 2014-10-04 NOTE — Discharge Instructions (Signed)

## 2014-10-04 NOTE — Anesthesia Procedure Notes (Signed)
Procedure Name: LMA Insertion Date/Time: 10/04/2014 7:33 AM Performed by: Melynda Ripple D Pre-anesthesia Checklist: Patient identified, Emergency Drugs available, Suction available and Patient being monitored Patient Re-evaluated:Patient Re-evaluated prior to inductionOxygen Delivery Method: Circle System Utilized Preoxygenation: Pre-oxygenation with 100% oxygen Intubation Type: IV induction Ventilation: Mask ventilation without difficulty LMA: LMA inserted LMA Size: 4.0 Number of attempts: 1 Airway Equipment and Method: Bite block Placement Confirmation: positive ETCO2 Tube secured with: Tape Dental Injury: Teeth and Oropharynx as per pre-operative assessment

## 2014-10-04 NOTE — Anesthesia Postprocedure Evaluation (Signed)
  Anesthesia Post-op Note  Patient: Robin Riley  Procedure(s) Performed: Procedure(s): LEFT BREAST LUMPECTOMY WITH RADIOACTIVE SEED LOCALIZATION (Left)  Patient Location: PACU  Anesthesia Type: General   Level of Consciousness: awake, alert  and oriented  Airway and Oxygen Therapy: Patient Spontanous Breathing  Post-op Pain: none  Post-op Assessment: Post-op Vital signs reviewed  Post-op Vital Signs: Reviewed  Last Vitals:  Filed Vitals:   10/04/14 0912  BP: 108/81  Pulse: 74  Temp: 36.6 C  Resp: 16    Complications: No apparent anesthesia complications

## 2014-10-04 NOTE — Op Note (Signed)
10/04/2014  8:20 AM  PATIENT:  Robin Riley  39 y.o. female  PRE-OPERATIVE DIAGNOSIS:  Left Breast Atypical Duct Hyperplasia  POST-OPERATIVE DIAGNOSIS:  Left Breast Atypical Duct Hyperplasia  PROCEDURE:  Procedure(s): LEFT BREAST LUMPECTOMY WITH RADIOACTIVE SEED LOCALIZATION (Left)  SURGEON:  Surgeon(s) and Role:    * Jovita Kussmaul, MD - Primary  PHYSICIAN ASSISTANT:   ASSISTANTS: none   ANESTHESIA:   general  EBL:  Total I/O In: 1000 [I.V.:1000] Out: -   BLOOD ADMINISTERED:none  DRAINS: none   LOCAL MEDICATIONS USED:  MARCAINE     SPECIMEN:  Source of Specimen:  left breast tissue  DISPOSITION OF SPECIMEN:  PATHOLOGY  COUNTS:  YES  TOURNIQUET:  * No tourniquets in log *  DICTATION: .Dragon Dictation  After informed consent was obtained the patient was brought to the operating room and placed in the supine position on the operating room table. After adequate induction of general anesthesia the patient's left breast was prepped with ChloraPrep, allowed to dry, and draped in usual sterile manner. Previously an I-125 seed was placed centrally in the left breast to mark an area of atypical duct hyperplasia. The neoprobe set to I-125 was able to identify the area of radioactivity centrally in the left breast near the lower outer quadrant. A periareolar incision was made with a 15 blade knife along the edge of the areola in the lower outer quadrant of the left breast. This incision was carried through the skin and subcutaneous tissue sharply with the electrocautery. While checking the area of radioactivity frequently with the neoprobe a circular portion of breast tissue was excised sharply around the seed. Once the specimen was removed it was oriented with the appropriate paint colors. A specimen radiograph was obtained that showed the clip and seed to be at the deep edge of the specimen. There was radioactivity in the specimen. There was no residual radioactivity at this point  in the breast. The specimen was then sent to pathology for further evaluation. An additional deep specimen was obtained sharply with the electrocautery and marked with a stitch on the true surgical margin. Hemostasis was achieved using the Bovie electrocautery. The wound was infiltrated with quarter percent Marcaine and irrigated with saline. The deep layer of the wound was then closed with layers of interrupted 3-0 Vicryl stitches. The skin was closed with interrupted 4-0 Monocryl subcuticular stitches. Dermabond dressings were applied. The patient tolerated the procedure well. At the end of the case all needle sponge and instrument counts were correct. The patient was then awakened and taken to recovery in stable condition.  PLAN OF CARE: Discharge to home after PACU  PATIENT DISPOSITION:  PACU - hemodynamically stable.   Delay start of Pharmacological VTE agent (>24hrs) due to surgical blood loss or risk of bleeding: not applicable

## 2014-10-04 NOTE — Transfer of Care (Signed)
Immediate Anesthesia Transfer of Care Note  Patient: Robin Riley  Procedure(s) Performed: Procedure(s): LEFT BREAST LUMPECTOMY WITH RADIOACTIVE SEED LOCALIZATION (Left)  Patient Location: PACU  Anesthesia Type:General  Level of Consciousness: awake, alert  and oriented  Airway & Oxygen Therapy: Patient Spontanous Breathing and Patient connected to face mask oxygen  Post-op Assessment: Report given to RN and Post -op Vital signs reviewed and stable  Post vital signs: Reviewed and stable  Last Vitals:  Filed Vitals:   10/04/14 0616  BP: 112/76  Pulse: 80  Temp: 36.7 C  Resp: 16    Complications: No apparent anesthesia complications

## 2014-10-04 NOTE — H&P (Signed)
Robin Riley 08/29/2014 11:41 AM Location: [facility] Patient #: [id number] DOB: 1975-09-19 Married / Language: English / Race: White Female  History of Present Illness Robin Riley. Robin Starks MD; 08/29/2014 12:25 PM) Patient words: breast f/u.  The patient is a 39 year old female who presents with a breast mass. We are asked to see the patient in consultation by Dr. Pamelia Riley to evaluate her for an abnormal mammogram. The patient is a 39 year old white female who recently went for a 6 month follow-up mammogram at which time she had 3 clusters of abnormal calcifications. The largest one in the lower outer quadrant measured 1.5 cm and was biopsied and came back as atypical duct hyperplasia. The smaller medial one was also biopsied and came back as fibrocystic disease. There was a very faint third area near the fibrocystic tissue that was too faint to biopsy. She denies any breast pain or discharge from the nipple. She has no personal or family history of breast cancer.   Other Problems Robin Riley, CMA; 08/29/2014 11:41 AM) No pertinent past medical history  Past Surgical History Robin Riley, CMA; 08/29/2014 11:41 AM) Breast Biopsy Left. multiple Oral Surgery Tonsillectomy  Diagnostic Studies History Robin Riley, CMA; 08/29/2014 11:41 AM) Colonoscopy never Mammogram within last year Pap Smear 1-5 years ago  Allergies Robin Riley, CMA; 08/29/2014 11:42 AM) Sulfa Antibiotics  Medication History (Robin Riley, CMA; 08/29/2014 11:43 AM) Calcium 600 (1500MG  Tablet, Oral) Active. Advil PM (200-25MG  Capsule, Oral) Active. Multivitamins (Oral) Active. Dexilant (60MG  Capsule DR, Oral as needed) Active. Medications Reconciled  Social History Robin Riley, CMA; 08/29/2014 11:41 AM) Caffeine use Coffee. No alcohol use No drug use Tobacco use Never smoker.  Family History Robin Riley, [place]; 08/29/2014 11:41 AM) Hypertension Mother. Ovarian Cancer Family Members In  General. Prostate Cancer Family Members In General.  Pregnancy / Birth History Robin Riley, [place]; 08/29/2014 11:41 AM) Age at menarche 86 years. Gravida 2 Irregular periods Maternal age 57-30 Para 2  Review of Systems Robin Riley CMA; 08/29/2014 11:41 AM) General Not Present- Appetite Loss, Chills, Fatigue, Fever, Night Sweats, Weight Gain and Weight Loss. Skin Not Present- Change in Wart/Mole, Dryness, Hives, Jaundice, New Lesions, Non-Healing Wounds, Rash and Ulcer. HEENT Present- Wears glasses/contact lenses. Not Present- Earache, Hearing Loss, Hoarseness, Nose Bleed, Oral Ulcers, Ringing in the Ears, Seasonal Allergies, Sinus Pain, Sore Throat, Visual Disturbances and Yellow Eyes. Respiratory Not Present- Bloody sputum, Chronic Cough, Difficulty Breathing, Snoring and Wheezing. Breast Not Present- Breast Mass, Breast Pain, Nipple Discharge and Skin Changes. Cardiovascular Not Present- Chest Pain, Difficulty Breathing Lying Down, Leg Cramps, Palpitations, Rapid Heart Rate, Shortness of Breath and Swelling of Extremities. Gastrointestinal Not Present- Abdominal Pain, Bloating, Bloody Stool, Change in Bowel Habits, Chronic diarrhea, Constipation, Difficulty Swallowing, Excessive gas, Gets full quickly at meals, Hemorrhoids, Indigestion, Nausea, Rectal Pain and Vomiting. Female Genitourinary Not Present- Frequency, Nocturia, Painful Urination, Pelvic Pain and Urgency. Musculoskeletal Not Present- Back Pain, Joint Pain, Joint Stiffness, Muscle Pain, Muscle Weakness and Swelling of Extremities. Neurological Not Present- Decreased Memory, Fainting, Headaches, Numbness, Seizures, Tingling, Tremor, Trouble walking and Weakness. Psychiatric Not Present- Anxiety, Bipolar, Change in Sleep Pattern, Depression, Fearful and Frequent crying. Endocrine Not Present- Cold Intolerance, Excessive Hunger, Hair Changes, Heat Intolerance, Hot flashes and New Diabetes. Hematology Not Present- Easy Bruising,  Excessive bleeding, Gland problems, HIV and Persistent Infections.   Vitals (Robin Riley CMA; 08/29/2014 11:42 AM) 08/29/2014 11:41 AM Weight: 140 lb Height: 63in Body Surface Area: 1.68 m Body Mass Index: 24.8  kg/m Temp.: 50F(Temporal)  Pulse: 85 (Regular)  BP: 130/78 (Sitting, Left Arm, Standard)    Physical Exam Robin Dibbles S. Robin Starks MD; 08/29/2014 12:25 PM) General Mental Status-Alert. General Appearance-Consistent with stated age. Hydration-Well hydrated. Voice-Normal.  Head and Neck Head-normocephalic, atraumatic with no lesions or palpable masses. Trachea-midline. Thyroid Gland Characteristics - normal size and consistency.  Eye Eyeball - Bilateral-Extraocular movements intact. Sclera/Conjunctiva - Bilateral-No scleral icterus.  Chest and Lung Exam Chest and lung exam reveals -quiet, even and easy respiratory effort with no use of accessory muscles and on auscultation, normal breath sounds, no adventitious sounds and normal vocal resonance. Inspection Chest Wall - Normal. Back - normal.  Breast Note: There is symmetric dense nodular fibrocystic tissue bilaterally in both breasts. There is no discrete palpable mass. There is no palpable axillary, supraclavicular, or cervical lymphadenopathy.   Cardiovascular Cardiovascular examination reveals -normal heart sounds, regular rate and rhythm with no murmurs and normal pedal pulses bilaterally.  Abdomen Inspection Inspection of the abdomen reveals - No Hernias. Skin - Scar - no surgical scars. Palpation/Percussion Palpation and Percussion of the abdomen reveal - Soft, Non Tender, No Rebound tenderness, No Rigidity (guarding) and No hepatosplenomegaly. Auscultation Auscultation of the abdomen reveals - Bowel sounds normal.  Neurologic Neurologic evaluation reveals -alert and oriented x 3 with no impairment of recent or remote memory. Mental Status-Normal.  Musculoskeletal Normal Exam  - Left-Upper Extremity Strength Normal and Lower Extremity Strength Normal. Normal Exam - Right-Upper Extremity Strength Normal and Lower Extremity Strength Normal.  Lymphatic Head & Neck  General Head & Neck Lymphatics: Bilateral - Description - Normal. Axillary  General Axillary Region: Bilateral - Description - Normal. Tenderness - Non Tender. Femoral & Inguinal  Generalized Femoral & Inguinal Lymphatics: Bilateral - Description - Normal. Tenderness - Non Tender.    Assessment & Plan Robin Dibbles S. Robin Starks MD; 08/29/2014 12:11 PM) ATYPICAL DUCTAL HYPERPLASIA OF LEFT BREAST (610.8  N60.92) Impression: The patient appears to have a small area of atypical ductal hyperplasia in the lower outer aspect of the left breast. Because this is a high risk lesion and can have an appearance similar to ductal carcinoma in situ I would recommend that this area be removed. I have discussed with her in detail the risks and benefits of the operation to remove this area as well as some of the technical aspects and she understands and wishes to proceed. I will plan for a left breast radioactive seed localized lumpectomy. I will also contact the radiologist about a third area that she mentioned. Current Plans  Pt Education - Breast Diseases: discussed with patient and provided information.   Signed by Luella Cook, MD (08/29/2014 12:31 PM)

## 2014-10-04 NOTE — Anesthesia Preprocedure Evaluation (Signed)
Anesthesia Evaluation  Patient identified by MRN, date of birth, ID band Patient awake    Reviewed: Allergy & Precautions, NPO status , Patient's Chart, lab work & pertinent test results  Airway Mallampati: I  TM Distance: >3 FB Neck ROM: Full    Dental  (+) Teeth Intact, Dental Advisory Given   Pulmonary  breath sounds clear to auscultation        Cardiovascular Rhythm:Regular Rate:Normal     Neuro/Psych    GI/Hepatic   Endo/Other    Renal/GU      Musculoskeletal   Abdominal   Peds  Hematology   Anesthesia Other Findings   Reproductive/Obstetrics                             Anesthesia Physical Anesthesia Plan  ASA: I  Anesthesia Plan: General   Post-op Pain Management:    Induction: Intravenous  Airway Management Planned: LMA  Additional Equipment:   Intra-op Plan:   Post-operative Plan: Extubation in OR  Informed Consent: I have reviewed the patients History and Physical, chart, labs and discussed the procedure including the risks, benefits and alternatives for the proposed anesthesia with the patient or authorized representative who has indicated his/her understanding and acceptance.   Dental advisory given  Plan Discussed with: Anesthesiologist, CRNA and Surgeon  Anesthesia Plan Comments:         Anesthesia Quick Evaluation

## 2014-10-05 ENCOUNTER — Encounter (HOSPITAL_BASED_OUTPATIENT_CLINIC_OR_DEPARTMENT_OTHER): Payer: Self-pay | Admitting: General Surgery

## 2014-10-18 ENCOUNTER — Telehealth: Payer: Self-pay | Admitting: *Deleted

## 2014-10-18 NOTE — Telephone Encounter (Signed)
Received referral from [facility].  Called pt and confirmed 11/14/14 High Risk appt w/ her.  Mailed calendar, welcoming packet & intake form to pt.  Emailed Shavon at [facility] to make her aware.  Placed a copy of the records in Dr. Ernestina Penna box and took on to HIM to scan.

## 2014-11-14 ENCOUNTER — Telehealth: Payer: Self-pay | Admitting: *Deleted

## 2014-11-14 ENCOUNTER — Ambulatory Visit: Payer: BLUE CROSS/BLUE SHIELD

## 2014-11-14 ENCOUNTER — Encounter: Payer: BLUE CROSS/BLUE SHIELD | Admitting: Hematology

## 2014-11-14 NOTE — Progress Notes (Signed)
Pt could not wait to be seen, appointment rescheduled  This encounter was created in error - please disregard.

## 2014-11-14 NOTE — Telephone Encounter (Signed)
Received notification that pt was here to see Dr. Burr Medico for her High Risk appt, but Dr. Burr Medico was running about 2 hours behind and the pt was unable to stay and wait.  Pt had requested to be seen in [place] since she works there.  Spoke with Elizebeth Koller who reached out to Tanya Nones at [facility] and received the doctors names of whom could see her.  I cancelled the appt in [facility].  Emailed Shavon at [facility] to make her aware of what happened and request for her to send a referral to [facility] for the pt to be seen there.  I emailed the pt to make her aware of everything that was done and informed her if she does not hear anything by Friday/Monday to please contact me and I will f/u for her.

## 2014-11-20 ENCOUNTER — Encounter: Payer: Self-pay | Admitting: Internal Medicine

## 2014-11-20 ENCOUNTER — Other Ambulatory Visit: Payer: Self-pay | Admitting: Internal Medicine

## 2014-11-20 DIAGNOSIS — N3000 Acute cystitis without hematuria: Secondary | ICD-10-CM

## 2014-11-20 DIAGNOSIS — N390 Urinary tract infection, site not specified: Secondary | ICD-10-CM | POA: Insufficient documentation

## 2014-11-20 MED ORDER — CIPROFLOXACIN HCL 250 MG PO TABS: 250.0000 mg | ORAL_TABLET | Freq: Two times a day (BID) | ORAL | Status: DC

## 2014-11-20 MED ORDER — FLUCONAZOLE 150 MG PO TABS: 150.0000 mg | ORAL_TABLET | Freq: Every day | ORAL | Status: DC

## 2014-11-20 NOTE — Telephone Encounter (Signed)
Left message for patient to call office, please advise I can get patient on with another provider today.

## 2014-12-24 ENCOUNTER — Encounter (INDEPENDENT_AMBULATORY_CARE_PROVIDER_SITE_OTHER): Payer: Self-pay

## 2014-12-24 ENCOUNTER — Inpatient Hospital Stay: Payer: BLUE CROSS/BLUE SHIELD | Attending: Oncology | Admitting: Oncology

## 2014-12-24 VITALS — Wt 143.3 lb

## 2014-12-24 DIAGNOSIS — Z79899 Other long term (current) drug therapy: Secondary | ICD-10-CM | POA: Insufficient documentation

## 2014-12-24 DIAGNOSIS — Z9889 Other specified postprocedural states: Secondary | ICD-10-CM | POA: Diagnosis present

## 2014-12-24 DIAGNOSIS — Z1239 Encounter for other screening for malignant neoplasm of breast: Secondary | ICD-10-CM

## 2014-12-24 NOTE — Progress Notes (Signed)
Patient had a mammogram in 01/2014 and was advised to repeat in 6 months.  Had a 6 month f/u with biopsy that resulted as benign with microcalcifications.

## 2014-12-25 ENCOUNTER — Ambulatory Visit: Payer: BLUE CROSS/BLUE SHIELD | Admitting: Oncology

## 2014-12-25 ENCOUNTER — Encounter: Payer: Self-pay | Admitting: Oncology

## 2014-12-25 ENCOUNTER — Telehealth: Payer: Self-pay | Admitting: Internal Medicine

## 2014-12-25 NOTE — Telephone Encounter (Signed)
Pt wants to know if she can get the shots or if she needs Tetanus/Tdap? Please let me know. If yes pt wants to get it done tomorrow when she come in. Thank You!

## 2014-12-25 NOTE — Telephone Encounter (Signed)
Patient had called and left vm message this morning nan I spoke with her. I offered her referral to Dietician and she has declined at this point in time, but will contact us if she changes her mind.

## 2014-12-25 NOTE — Telephone Encounter (Signed)
Explained to patient that she can have it done in the office, but that she should check with her insurance for coverage.

## 2014-12-26 ENCOUNTER — Ambulatory Visit (INDEPENDENT_AMBULATORY_CARE_PROVIDER_SITE_OTHER): Payer: BLUE CROSS/BLUE SHIELD | Admitting: Internal Medicine

## 2014-12-26 ENCOUNTER — Encounter: Payer: Self-pay | Admitting: Internal Medicine

## 2014-12-26 VITALS — BP 120/76 | HR 79 | Temp 97.8°F | Resp 12 | Ht 63.0 in | Wt 141.5 lb

## 2014-12-26 DIAGNOSIS — Z113 Encounter for screening for infections with a predominantly sexual mode of transmission: Secondary | ICD-10-CM | POA: Diagnosis not present

## 2014-12-26 DIAGNOSIS — R5383 Other fatigue: Secondary | ICD-10-CM

## 2014-12-26 DIAGNOSIS — Z0000 Encounter for general adult medical examination without abnormal findings: Secondary | ICD-10-CM

## 2014-12-26 DIAGNOSIS — E538 Deficiency of other specified B group vitamins: Secondary | ICD-10-CM

## 2014-12-26 DIAGNOSIS — E785 Hyperlipidemia, unspecified: Secondary | ICD-10-CM | POA: Diagnosis not present

## 2014-12-26 DIAGNOSIS — K2970 Gastritis, unspecified, without bleeding: Secondary | ICD-10-CM

## 2014-12-26 LAB — LIPID PANEL
CHOL/HDL RATIO: 3
CHOLESTEROL: 177 mg/dL (ref 0–200)
HDL: 63.9 mg/dL (ref >39.00)
LDL CALC: 107 mg/dL — AB (ref 0–99)
NONHDL: 113.02
Triglycerides: 30 mg/dL (ref 0.0–149.0)
VLDL: 6 mg/dL (ref 0.0–40.0)

## 2014-12-26 LAB — COMPREHENSIVE METABOLIC PANEL
ALBUMIN: 4.4 g/dL (ref 3.5–5.2)
ALT: 17 U/L (ref 0–35)
AST: 22 U/L (ref 0–37)
Alkaline Phosphatase: 51 U/L (ref 39–117)
BUN: 14 mg/dL (ref 6–23)
CHLORIDE: 103 meq/L (ref 96–112)
CO2: 29 mEq/L (ref 19–32)
CREATININE: 0.66 mg/dL (ref 0.40–1.20)
Calcium: 9.8 mg/dL (ref 8.4–10.5)
GFR: 104.87 mL/min (ref >60.00)
GLUCOSE: 83 mg/dL (ref 70–99)
Potassium: 4.3 mEq/L (ref 3.5–5.1)
SODIUM: 139 meq/L (ref 135–145)
Total Bilirubin: 0.6 mg/dL (ref 0.2–1.2)
Total Protein: 7.4 g/dL (ref 6.0–8.3)

## 2014-12-26 MED ORDER — SPIRONOLACTONE 25 MG PO TABS: 25.0000 mg | ORAL_TABLET | Freq: Every day | ORAL | Status: DC

## 2014-12-26 NOTE — Progress Notes (Signed)
Pre-visit discussion using our clinic review tool. No additional management support is needed unless otherwise documented below in the visit note.  

## 2014-12-26 NOTE — Progress Notes (Signed)
Patient ID: Robin Riley, female    DOB: 10/22/1975  Age: 39 y.o. MRN: [id number]  The patient is here for annual Medicare wellness examination and management of other chronic and acute problems.   PAP normal 2015,  Dr Robin Riley does one every year ??? History of enodometrial  Ablation due to irregular  Cycle  5 yrs ago,  Periods are not regular Mammogram scheduled for Dec.  History of lumpectomy benign,  microcalcifications ,  No FH of BRCA  Not using dexilant daily bc not using NSAIDs daily    The risk factors are reflected in the social history.  The roster of all physicians providing medical care to patient - is listed in the Snapshot section of the chart.  Home safety : The patient has smoke detectors in the home. They wear seatbelts.  There are no firearms at home. There is no violence in the home.   There is no risks for hepatitis, STDs or HIV. There is no   history of blood transfusion. They have no travel history to infectious disease endemic areas of the world.  The patient has seen their dentist in the last six month. They have seen their eye doctor in the last year. They admit to slight hearing difficulty with regard to whispered voices and some television programs.  They have deferred audiologic testing in the last year.  They do not  have excessive sun exposure. Discussed the need for sun protection: hats, long sleeves and use of sunscreen if there is significant sun exposure.   Diet: the importance of a healthy diet is discussed. They do have a healthy diet.  The benefits of regular aerobic exercise were discussed. She walks  3 times per week ,  60 minutes.   Depression screen: there are no signs or vegative symptoms of depression- irritability, change in appetite, anhedonia, sadness/tearfullness.  The following portions of the patient's history were reviewed and updated as appropriate: allergies, current medications, past family history, past medical history,  past surgical  history, past social history  and problem list.  Visual acuity was not assessed per patient preference since she has regular follow up with her ophthalmologist. Hearing and body mass index were assessed and reviewed.   During the course of the visit the patient was educated and counseled about appropriate screening and preventive services including : fall prevention , diabetes screening, nutrition counseling, colorectal cancer screening, and recommended immunizations.    CC: The primary encounter diagnosis was Screen for STD (sexually transmitted disease). Diagnoses of Hyperlipidemia, Other fatigue, Gastritis, B12 deficiency, and Encounter for preventive health examination were also pertinent to this visit.  1) Premenstrual fluid retention and breast tenderness.  Notices wt gain of around 2 lbs before menses,  Breast tender in the area of prior lumpectomy  Resolves a week after menses.  No headaches or vision chnages,  No dyspnea,  2) Costochondritis resolved.  Only using NSAIDS once a week.  No abd pain  Not using Dexilant  Too $$$  History Robin Riley has a past medical history of Chest pain (2008); Acne; Adrenal tumor (2008); Gastritis (Sept 2008); and L4 vertebral fracture ([place]).   She has past surgical history that includes Endometrial ablation (2012); Tonsillectomy; and Breast lumpectomy with radioactive seed localization (Left, 10/04/2014).   Her family history includes Cancer in her maternal aunt; Diabetes in her maternal grandmother; Heart disease in her maternal grandfather; Hypertension in her mother; Osteoporosis in her maternal grandmother and mother.She reports that she has never  smoked. She has never used smokeless tobacco. She reports that she does not drink alcohol or use illicit drugs.  Outpatient Prescriptions Prior to Visit  Medication Sig Dispense Refill  . calcium citrate-vitamin D 200-200 MG-UNIT TABS Take 1 tablet by mouth daily.    Marland Kitchen dexlansoprazole (DEXILANT) 60 MG capsule  Take 1 capsule (60 mg total) by mouth daily. 90 capsule 1  . ibuprofen (ADVIL,MOTRIN) 200 MG tablet Take 200 mg by mouth as needed.      . Multiple Vitamin (MULTIVITAMIN) tablet Take 1 tablet by mouth daily.       No facility-administered medications prior to visit.    Review of Systems  Patient denies headache, fevers, malaise, unintentional weight loss, skin rash, eye pain, sinus congestion and sinus pain, sore throat, dysphagia,  hemoptysis , cough, dyspnea, wheezing, chest pain, palpitations, orthopnea, edema, abdominal pain, nausea, melena, diarrhea, constipation, flank pain, dysuria, hematuria, urinary  Frequency, nocturia, numbness, tingling, seizures,  Focal weakness, Loss of consciousness,  Tremor, insomnia, depression, anxiety, and suicidal ideation.      Objective:  BP 120/76 mmHg  Pulse 79  Temp(Src) 97.8 F (36.6 C) (Oral)  Resp 12  Ht _0  (1.6 m)  Wt 141 lb 8 oz (64.184 kg)  BMI 25.07 kg/m2  SpO2 99%  LMP 12/21/2014  Physical Exam   General appearance: alert, cooperative and appears stated age Ears: normal TM's and external ear canals both ears Throat: lips, mucosa, and tongue normal; teeth and gums normal Neck: no adenopathy, no carotid bruit, supple, symmetrical, trachea midline and thyroid not enlarged, symmetric, no tenderness/mass/nodules Back: symmetric, no curvature. ROM normal. No CVA tenderness. Lungs: clear to auscultation bilaterally Heart: regular rate and rhythm, S1, S2 normal, no murmur, click, rub or gallop Abdomen: soft, non-tender; bowel sounds normal; no masses,  no organomegaly Pulses: 2+ and symmetric Skin: Skin color, texture, turgor normal. No rashes or lesions Lymph nodes: Cervical, supraclavicular, and axillary nodes normal.    Assessment & Plan:   Problem List Items Addressed This Visit    Gastritis    Secondary to NSAID use,  Now resolved,  Prn use of PPI or daily use of H2 blocker discussed.       B12 deficiency    managed  with monthly IM injections by mother who is an Tourist information centre manager for preventive health examination    Annual comprehensive preventive exam was done as well as an evaluation and management of chronic conditions .  During the course of the visit the patient was educated and counseled about appropriate screening and preventive services including :  breast cancer and cervical cancer screening diabetes screening, lipid analysis , nutrition counseling, screening for Hep C and HIV per CDC guidelines,  and recommended immunizations.  Printed recommendations for health maintenance screenings was given.   Lab Results  Component Value Date   CHOL 177 12/26/2014   HDL 63.90 12/26/2014   LDLCALC 107* 12/26/2014   TRIG 30.0 12/26/2014   CHOLHDL 3 12/26/2014   Lab Results  Component Value Date   TSH 0.71 01/05/2013          Other Visit Diagnoses    Screen for STD (sexually transmitted disease)    -  Primary    Relevant Orders    Hepatitis C antibody (Completed)    HIV antibody (Completed)    Hyperlipidemia        Relevant Medications    spironolactone (ALDACTONE) 25 MG tablet  Other Relevant Orders    Lipid panel (Completed)    Other fatigue        Relevant Orders    Comprehensive metabolic panel (Completed)       I am having Robin Riley start on spironolactone. I am also having her maintain her ibuprofen, multivitamin, calcium citrate-vitamin D, and dexlansoprazole.  Meds ordered this encounter  Medications  . spironolactone (ALDACTONE) 25 MG tablet    Sig: Take 1 tablet (25 mg total) by mouth daily. As needed for fluid retention    Dispense:  30 tablet    Refill:  0    There are no discontinued medications.  Follow-up: Return in about 1 year (around 12/26/2015).   Crecencio Mc, MD

## 2014-12-26 NOTE — Patient Instructions (Addendum)
I appreciate your concern about continuing Dexilant,  Especially in light of the recently published studies suggesting an association with increased risk of dementia and kidney failure.  I advise you to try switching  From your PPI to either famotidine 20 mg once or twice daily,  or to  ranitidine 150 mg once or twice daily.  These medications are  H2 blockers and are available without a prescriptions.   if your reflux symptoms are controlled,  You can Continue the daily h2 blocker.   Check with your insurance about whether they will cover a TDaP and WHERE/HOW IT needs to be given (Pharmacy vs clinic)  I have prescribed Spironolactone; it  can be used as needed for fluid retention   I recommend taking 1000 IUS of D3 daily through the winter months   The  diet I discussed with you today is the 10 day Green Smoothie Cleansing /Detox Diet by Linden Dolin . available on [place] for around $10.  This is not a low carb or a weight loss diet,  It is fundamentally a "cleansing" low fat diet that eliminates sugar, gluten, caffeine, alcohol and dairy for 10 days .  What you add back after the initial ten days is entirely up to  you!  You can expect to lose 5 to 10 lbs depending on how strict you are.   I found that  drinking 2 smoothies or juices  daily and keeping one chewable meal (but keep it simple, like baked fish and salad, rice or bok choy) kept me satisfied and kept me from straying  .  You snack primarily on fresh  fruit, egg whites and judicious quantities of nuts. I  Recommend adding a  vegetable based protein powder  To any smoothie made with almond milk.  (nothing with whey , since whey is dairy) in it.  [employer] has a Research officer, political party .   It does require some form of a nutrient extractor (Vita Mix, a electric juicer,  Or a Nutribullet Rx).  i have found that using frozen fruits is much more convenient and cost effective. You can even find plenty of organic fruit in the frozen fruit section of [employer].   Just thaw what you need for the following day the night before in the refrigerator (to avoid jamming up your machine)    The organic greens drink I use if I don't have any fresh greens  is called "Suja" and it's sold in the vegetable refrigerated section of most grocery stores (including [employer])  . It is tart, though, so be careful (has lemon juice in it )  The organic vegan protein powder I tried  is called Vega" and I found it at [employer] .  It is sugar free.  Health Maintenance, Female Adopting a healthy lifestyle and getting preventive care can go a long way to promote health and wellness. Talk with your health care provider about what schedule of regular examinations is right for you. This is a good chance for you to check in with your provider about disease prevention and staying healthy. In between checkups, there are plenty of things you can do on your own. Experts have done a lot of research about which lifestyle changes and preventive measures are most likely to keep you healthy. Ask your health care provider for more information. WEIGHT AND DIET  Eat a healthy diet  Be sure to include plenty of vegetables, fruits, low-fat dairy products, and lean protein.  Do not eat  a lot of foods high in solid fats, added sugars, or salt.  Get regular exercise. This is one of the most important things you can do for your health.  Most adults should exercise for at least 150 minutes each week. The exercise should increase your heart rate and make you sweat (moderate-intensity exercise).  Most adults should also do strengthening exercises at least twice a week. This is in addition to the moderate-intensity exercise.  Maintain a healthy weight  Body mass index (BMI) is a measurement that can be used to identify possible weight problems. It estimates body fat based on height and weight. Your health care provider can help determine your BMI and help you achieve or maintain a healthy weight.  For  females 21 years of age and older:   A BMI below 18.5 is considered underweight.  A BMI of 18.5 to 24.9 is normal.  A BMI of 25 to 29.9 is considered overweight.  A BMI of 30 and above is considered obese.  Watch levels of cholesterol and blood lipids  You should start having your blood tested for lipids and cholesterol at 41 years of age, then have this test every 5 years.  You may need to have your cholesterol levels checked more often if:  Your lipid or cholesterol levels are high.  You are older than 42 years of age.  You are at high risk for heart disease.  CANCER SCREENING   Lung Cancer  Lung cancer screening is recommended for adults 38-73 years old who are at high risk for lung cancer because of a history of smoking.  A yearly low-dose CT scan of the lungs is recommended for people who:  Currently smoke.  Have quit within the past 15 years.  Have at least a 30-pack-year history of smoking. A pack year is smoking an average of one pack of cigarettes a day for 1 year.  Yearly screening should continue until it has been 15 years since you quit.  Yearly screening should stop if you develop a health problem that would prevent you from having lung cancer treatment.  Breast Cancer  Practice breast self-awareness. This means understanding how your breasts normally appear and feel.  It also means doing regular breast self-exams. Let your health care provider know about any changes, no matter how small.  If you are in your 20s or 30s, you should have a clinical breast exam (CBE) by a health care provider every 1-3 years as part of a regular health exam.  If you are 70 or older, have a CBE every year. Also consider having a breast X-ray (mammogram) every year.  If you have a family history of breast cancer, talk to your health care provider about genetic screening.  If you are at high risk for breast cancer, talk to your health care provider about having an MRI and  a mammogram every year.  Breast cancer gene (BRCA) assessment is recommended for women who have family members with BRCA-related cancers. BRCA-related cancers include:  Breast.  Ovarian.  Tubal.  Peritoneal cancers.  Results of the assessment will determine the need for genetic counseling and BRCA1 and BRCA2 testing. Cervical Cancer Your health care provider may recommend that you be screened regularly for cancer of the pelvic organs (ovaries, uterus, and vagina). This screening involves a pelvic examination, including checking for microscopic changes to the surface of your cervix (Pap test). You may be encouraged to have this screening done every 3 years, beginning at age  28.  For women ages 26-65, health care providers may recommend pelvic exams and Pap testing every 3 years, or they may recommend the Pap and pelvic exam, combined with testing for human papilloma virus (HPV), every 5 years. Some types of HPV increase your risk of cervical cancer. Testing for HPV may also be done on women of any age with unclear Pap test results.  Other health care providers may not recommend any screening for nonpregnant women who are considered low risk for pelvic cancer and who do not have symptoms. Ask your health care provider if a screening pelvic exam is right for you.  If you have had past treatment for cervical cancer or a condition that could lead to cancer, you need Pap tests and screening for cancer for at least 20 years after your treatment. If Pap tests have been discontinued, your risk factors (such as having a new sexual partner) need to be reassessed to determine if screening should resume. Some women have medical problems that increase the chance of getting cervical cancer. In these cases, your health care provider may recommend more frequent screening and Pap tests. Colorectal Cancer  This type of cancer can be detected and often prevented.  Routine colorectal cancer screening usually  begins at 41 years of age and continues through 41 years of age.  Your health care provider may recommend screening at an earlier age if you have risk factors for colon cancer.  Your health care provider may also recommend using home test kits to check for hidden blood in the stool.  A small camera at the end of a tube can be used to examine your colon directly (sigmoidoscopy or colonoscopy). This is done to check for the earliest forms of colorectal cancer.  Routine screening usually begins at age 68.  Direct examination of the colon should be repeated every 5-10 years through 41 years of age. However, you may need to be screened more often if early forms of precancerous polyps or small growths are found. Skin Cancer  Check your skin from head to toe regularly.  Tell your health care provider about any new moles or changes in moles, especially if there is a change in a mole's shape or color.  Also tell your health care provider if you have a mole that is larger than the size of a pencil eraser.  Always use sunscreen. Apply sunscreen liberally and repeatedly throughout the day.  Protect yourself by wearing long sleeves, pants, a wide-brimmed hat, and sunglasses whenever you are outside. HEART DISEASE, DIABETES, AND HIGH BLOOD PRESSURE   High blood pressure causes heart disease and increases the risk of stroke. High blood pressure is more likely to develop in:  People who have blood pressure in the high end of the normal range (130-139/85-89 mm Hg).  People who are overweight or obese.  People who are African American.  If you are 42-52 years of age, have your blood pressure checked every 3-5 years. If you are 22 years of age or older, have your blood pressure checked every year. You should have your blood pressure measured twice--once when you are at a hospital or clinic, and once when you are not at a hospital or clinic. Record the average of the two measurements. To check your blood  pressure when you are not at a hospital or clinic, you can use:  An automated blood pressure machine at a pharmacy.  A home blood pressure monitor.  If you are between 55 years  and 19 years old, ask your health care provider if you should take aspirin to prevent strokes.  Have regular diabetes screenings. This involves taking a blood sample to check your fasting blood sugar level.  If you are at a normal weight and have a low risk for diabetes, have this test once every three years after 41 years of age.  If you are overweight and have a high risk for diabetes, consider being tested at a younger age or more often. PREVENTING INFECTION  Hepatitis B  If you have a higher risk for hepatitis B, you should be screened for this virus. You are considered at high risk for hepatitis B if:  You were born in a country where hepatitis B is common. Ask your health care provider which countries are considered high risk.  Your parents were born in a high-risk country, and you have not been immunized against hepatitis B (hepatitis B vaccine).  You have HIV or AIDS.  You use needles to inject street drugs.  You live with someone who has hepatitis B.  You have had sex with someone who has hepatitis B.  You get hemodialysis treatment.  You take certain medicines for conditions, including cancer, organ transplantation, and autoimmune conditions. Hepatitis C  Blood testing is recommended for:  Everyone born from [birth year] through 1967.  Anyone with known risk factors for hepatitis C. Sexually transmitted infections (STIs)  You should be screened for sexually transmitted infections (STIs) including gonorrhea and chlamydia if:  You are sexually active and are younger than 41 years of age.  You are older than 41 years of age and your health care provider tells you that you are at risk for this type of infection.  Your sexual activity has changed since you were last screened and you are at an  increased risk for chlamydia or gonorrhea. Ask your health care provider if you are at risk.  If you do not have HIV, but are at risk, it may be recommended that you take a prescription medicine daily to prevent HIV infection. This is called pre-exposure prophylaxis (PrEP). You are considered at risk if:  You are sexually active and do not regularly use condoms or know the HIV status of your partner(s).  You take drugs by injection.  You are sexually active with a partner who has HIV. Talk with your health care provider about whether you are at high risk of being infected with HIV. If you choose to begin PrEP, you should first be tested for HIV. You should then be tested every 3 months for as long as you are taking PrEP.  PREGNANCY   If you are premenopausal and you may become pregnant, ask your health care provider about preconception counseling.  If you may become pregnant, take 400 to 800 micrograms (mcg) of folic acid every day.  If you want to prevent pregnancy, talk to your health care provider about birth control (contraception). OSTEOPOROSIS AND MENOPAUSE   Osteoporosis is a disease in which the bones lose minerals and strength with aging. This can result in serious bone fractures. Your risk for osteoporosis can be identified using a bone density scan.  If you are 11 years of age or older, or if you are at risk for osteoporosis and fractures, ask your health care provider if you should be screened.  Ask your health care provider whether you should take a calcium or vitamin D supplement to lower your risk for osteoporosis.  Menopause may have certain physical  symptoms and risks.  Hormone replacement therapy may reduce some of these symptoms and risks. Talk to your health care provider about whether hormone replacement therapy is right for you.  HOME CARE INSTRUCTIONS   Schedule regular health, dental, and eye exams.  Stay current with your immunizations.   Do not use any  tobacco products including cigarettes, chewing tobacco, or electronic cigarettes.  If you are pregnant, do not drink alcohol.  If you are breastfeeding, limit how much and how often you drink alcohol.  Limit alcohol intake to no more than 1 drink per day for nonpregnant women. One drink equals 12 ounces of beer, 5 ounces of wine, or 1 ounces of hard liquor.  Do not use street drugs.  Do not share needles.  Ask your health care provider for help if you need support or information about quitting drugs.  Tell your health care provider if you often feel depressed.  Tell your health care provider if you have ever been abused or do not feel safe at home.   This information is not intended to replace advice given to you by your health care provider. Make sure you discuss any questions you have with your health care provider.   Document Released: 09/01/2010 Document Revised: 03/09/2014 Document Reviewed: 01/18/2013 Elsevier Interactive Patient Education [employer].

## 2014-12-27 LAB — HIV ANTIBODY (ROUTINE TESTING W REFLEX): HIV: NONREACTIVE

## 2014-12-27 LAB — HEPATITIS C ANTIBODY: HCV AB: NEGATIVE

## 2014-12-28 NOTE — Assessment & Plan Note (Signed)
managed with monthly IM injections by mother who is an Therapist, sports

## 2014-12-28 NOTE — Assessment & Plan Note (Signed)
Secondary to NSAID use,  Now resolved,  Prn use of PPI or daily use of H2 blocker discussed.

## 2014-12-28 NOTE — Assessment & Plan Note (Addendum)
Annual comprehensive preventive exam was done as well as an evaluation and management of chronic conditions .  During the course of the visit the patient was educated and counseled about appropriate screening and preventive services including :  breast cancer and cervical cancer screening diabetes screening, lipid analysis , nutrition counseling, screening for Hep C and HIV per CDC guidelines,  and recommended immunizations.  Printed recommendations for health maintenance screenings was given.   Lab Results  Component Value Date   CHOL 177 12/26/2014   HDL 63.90 12/26/2014   LDLCALC 107* 12/26/2014   TRIG 30.0 12/26/2014   CHOLHDL 3 12/26/2014   Lab Results  Component Value Date   TSH 0.71 01/05/2013

## 2014-12-29 ENCOUNTER — Encounter: Payer: Self-pay | Admitting: Internal Medicine

## 2014-12-31 NOTE — Progress Notes (Signed)
[facility]  [phone] [phone]) [phone]  ID: Robin Riley OB: [birth year]  MR#: [id number]  QIO#:[id number]  Patient Care Team: Crecencio Mc, MD as PCP - General (Internal Medicine)  CHIEF COMPLAINT:  Chief Complaint  Patient presents with  . New Evaluation    high risk management     INTERVAL HISTORY: Patient is a 39 year old female who presents to clinic for discussion of high-risk management of developing breast cancer and consideration of chemoprevention with tamoxifen. She recently underwent lumpectomy but that did not reveal DCIS or invasive malignancy, but was positive for atypical ductal hyperplasia. She does not report any family history of breast cancer, but does have a maternal aunt with ovarian cancer. She currently feels well and is asymptomatic.  REVIEW OF SYSTEMS:   Review of Systems  Constitutional: Negative.   Musculoskeletal: Negative.   Neurological: Negative.     As per HPI. Otherwise, a complete review of systems is negatve.  PAST MEDICAL HISTORY: Past Medical History  Diagnosis Date  . Chest pain 2008    negative myoview, attributed to costochondritis  . Acne     managed by Nehemiah Massed  . Adrenal tumor 2008    found during workup for hypertension  . Gastritis Sept 2008    mild, EGD [facility]re[facility] Allen Norris   . L4 vertebral fracture ([place])     remote    PAST SURGICAL HISTORY: Past Surgical History  Procedure Laterality Date  . Endometrial ablation  2012  . Tonsillectomy    . Breast lumpectomy with radioactive seed localization Left 10/04/2014    Procedure: LEFT BREAST LUMPECTOMY WITH RADIOACTIVE SEED LOCALIZATION;  Surgeon: Autumn Messing III, MD;  Location: [facility];  Service: General;  Laterality: Left;    FAMILY HISTORY Family History  Problem Relation Age of Onset  . Osteoporosis Mother   . Hypertension Mother   . Cancer Maternal Aunt     ovarian  . Heart disease Maternal Grandfather   . Osteoporosis  Maternal Grandmother   . Diabetes Maternal Grandmother        ADVANCED DIRECTIVES:    HEALTH MAINTENANCE: Social History  Substance Use Topics  . Smoking status: Never Smoker   . Smokeless tobacco: Never Used  . Alcohol Use: No     Colonoscopy:  PAP:  Bone density:  Lipid panel:  Allergies  Allergen Reactions  . Sulfa Antibiotics     Current Outpatient Prescriptions  Medication Sig Dispense Refill  . calcium citrate-vitamin D 200-200 MG-UNIT TABS Take 1 tablet by mouth daily.    Marland Kitchen dexlansoprazole (DEXILANT) 60 MG capsule Take 1 capsule (60 mg total) by mouth daily. 90 capsule 1  . ibuprofen (ADVIL,MOTRIN) 200 MG tablet Take 200 mg by mouth as needed.      . Multiple Vitamin (MULTIVITAMIN) tablet Take 1 tablet by mouth daily.      Marland Kitchen spironolactone (ALDACTONE) 25 MG tablet Take 1 tablet (25 mg total) by mouth daily. As needed for fluid retention 30 tablet 0   No current facility-administered medications for this visit.    OBJECTIVE: There were no vitals filed for this visit.   Body mass index is 25.39 kg/(m^2).    ECOG FS:0 - Asymptomatic  General: Well-developed, well-nourished, no acute distress. Eyes: Pink conjunctiva, anicteric sclera. Musculoskeletal: No edema, cyanosis, or clubbing. Neuro: Alert, answering all questions appropriately. Cranial nerves grossly intact. Skin: No rashes or petechiae noted. Psych: Normal affect.   LAB RESULTS:  Lab Results  Component Value Date  NA 139 12/26/2014   K 4.3 12/26/2014   CL 103 12/26/2014   CO2 29 12/26/2014   GLUCOSE 83 12/26/2014   BUN 14 12/26/2014   CREATININE 0.66 12/26/2014   CALCIUM 9.8 12/26/2014   PROT 7.4 12/26/2014   ALBUMIN 4.4 12/26/2014   AST 22 12/26/2014   ALT 17 12/26/2014   ALKPHOS 51 12/26/2014   BILITOT 0.6 12/26/2014    Lab Results  Component Value Date   WBC 5.0 01/05/2013   NEUTROABS 3.1 01/05/2013   HGB 12.3 01/05/2013   HCT 37.3 01/05/2013   MCV 83.1 01/05/2013   PLT 200.0  01/05/2013     STUDIES: No results found.  ASSESSMENT: Breast cancer risk assessment.  PLAN:    1. Breast cancer risk: Using the [place] for breast cancer risk, patient's five-year risk of developing breast cancer is 2.1%. The risk of the general population of a female her age is 0.6%. Her lifetime risk of developing breast cancer increases it to 26.6% whereas the general population is 12.4%. After lengthy discussion with the patient, she does not wish to undergo tamoxifen prophylaxis for 5 years given the risks associated with tamoxifen and only the modest benefit of risk reduction for breast cancer. Patient has agreed that is imperative to continue her yearly mammograms as well as Pap smears as indicated. No follow-up has been scheduled. Patient has been instructed that if she reconsiders wishing to initiate tamoxifen she can call clinic for further evaluation and discussion.  Approximately 45 minutes was spent in discussion and consultation.   Lloyd Huger, MD   12/31/2014 1:32 PM

## 2015-02-28 ENCOUNTER — Other Ambulatory Visit: Payer: Self-pay | Admitting: Obstetrics & Gynecology

## 2015-03-01 LAB — CYTOLOGY - PAP

## 2015-04-09 ENCOUNTER — Encounter: Payer: Self-pay | Admitting: Internal Medicine

## 2015-04-09 MED ORDER — CIPROFLOXACIN HCL 250 MG PO TABS: 250.0000 mg | ORAL_TABLET | Freq: Two times a day (BID) | ORAL | Status: DC

## 2015-04-09 MED ORDER — FLUCONAZOLE 150 MG PO TABS: 150.0000 mg | ORAL_TABLET | Freq: Every day | ORAL | Status: DC

## 2015-04-09 NOTE — Telephone Encounter (Signed)
Can I schedule patient at 4.30 for symptoms of UTI, per My chart Message " burning , frequency, Urinating small but frequent amounts of urine, has taken some cipro from a previous infection. Morning appointments full.

## 2015-11-03 ENCOUNTER — Other Ambulatory Visit: Payer: Self-pay | Admitting: Internal Medicine

## 2015-11-03 DIAGNOSIS — E538 Deficiency of other specified B group vitamins: Secondary | ICD-10-CM

## 2015-11-05 NOTE — Telephone Encounter (Signed)
Looks as though B 12 was D/c 12/2014 ok to fill?

## 2015-11-11 ENCOUNTER — Other Ambulatory Visit: Payer: Self-pay | Admitting: *Deleted

## 2015-11-11 ENCOUNTER — Other Ambulatory Visit: Payer: Self-pay | Admitting: Internal Medicine

## 2015-11-11 DIAGNOSIS — E538 Deficiency of other specified B group vitamins: Secondary | ICD-10-CM

## 2015-11-11 MED ORDER — CYANOCOBALAMIN 1000 MCG/ML IJ SOLN: 1000.0000 ug | INTRAMUSCULAR | 0 refills | Status: DC

## 2015-11-11 MED ORDER — CYANOCOBALAMIN 1000 MCG/ML IJ SOLN
1000.0000 ug | INTRAMUSCULAR | 1 refills | Status: DC
Start: 2015-11-11 — End: 2017-03-17

## 2015-11-18 ENCOUNTER — Encounter: Payer: Self-pay | Admitting: Internal Medicine

## 2015-12-05 ENCOUNTER — Encounter: Payer: Self-pay | Admitting: Internal Medicine

## 2016-01-09 ENCOUNTER — Ambulatory Visit (INDEPENDENT_AMBULATORY_CARE_PROVIDER_SITE_OTHER): Payer: 59 | Admitting: Internal Medicine

## 2016-01-09 ENCOUNTER — Encounter: Payer: Self-pay | Admitting: Internal Medicine

## 2016-01-09 VITALS — BP 98/68 | HR 90 | Temp 98.0°F | Resp 16 | Ht 62.75 in | Wt 136.4 lb

## 2016-01-09 DIAGNOSIS — Z23 Encounter for immunization: Secondary | ICD-10-CM

## 2016-01-09 DIAGNOSIS — E559 Vitamin D deficiency, unspecified: Secondary | ICD-10-CM | POA: Diagnosis not present

## 2016-01-09 DIAGNOSIS — R5383 Other fatigue: Secondary | ICD-10-CM | POA: Diagnosis not present

## 2016-01-09 DIAGNOSIS — Z0000 Encounter for general adult medical examination without abnormal findings: Secondary | ICD-10-CM

## 2016-01-09 DIAGNOSIS — Z1231 Encounter for screening mammogram for malignant neoplasm of breast: Secondary | ICD-10-CM

## 2016-01-09 DIAGNOSIS — N6092 Unspecified benign mammary dysplasia of left breast: Secondary | ICD-10-CM

## 2016-01-09 DIAGNOSIS — Z1239 Encounter for other screening for malignant neoplasm of breast: Secondary | ICD-10-CM

## 2016-01-09 LAB — COMPREHENSIVE METABOLIC PANEL
ALT: 22 U/L (ref 0–35)
AST: 20 U/L (ref 0–37)
Albumin: 4.6 g/dL (ref 3.5–5.2)
Alkaline Phosphatase: 54 U/L (ref 39–117)
BILIRUBIN TOTAL: 0.5 mg/dL (ref 0.2–1.2)
BUN: 11 mg/dL (ref 6–23)
CALCIUM: 9.6 mg/dL (ref 8.4–10.5)
CO2: 28 meq/L (ref 19–32)
CREATININE: 0.61 mg/dL (ref 0.40–1.20)
Chloride: 105 mEq/L (ref 96–112)
GFR: 114.27 mL/min (ref >60.00)
GLUCOSE: 84 mg/dL (ref 70–99)
Potassium: 4.3 mEq/L (ref 3.5–5.1)
Sodium: 139 mEq/L (ref 135–145)
TOTAL PROTEIN: 7.2 g/dL (ref 6.0–8.3)

## 2016-01-09 LAB — TSH: TSH: 1.07 u[IU]/mL (ref 0.35–4.50)

## 2016-01-09 LAB — VITAMIN D 25 HYDROXY (VIT D DEFICIENCY, FRACTURES): VITD: 26.86 ng/mL — AB (ref 30.00–100.00)

## 2016-01-09 NOTE — Progress Notes (Signed)
Patient ID: Robin Riley, female    DOB: Oct 13, 1975  Age: 40 y.o. MRN: [id number]  The patient is here for annual physical examination .    PAP smear normal 2016 History of atypical ductal hyperplasia by biopsy 2016 left breast .  Managed by [facility] .  Referred to Tim Finnegan/Oncology for surveillance in the high risk breast clinic.    The risk factors are reflected in the social history.  The roster of all physicians prding medical care to patient - is listed in the Snapshot section of the chart.  Home safety : The patient has smoke detectors in the home. They wear seatbelts.  There are no firearms at home. There is no violence in the home.   There is no risks for hepatitis, STDs or HIV. There is no   history of blood transfusion. They have no travel history to infectious disease endemic areas of the world.  The patient has seen their dentist in the last six month. They have seen their eye doctor in the last year.    Discussed the need for sun protection: hats, long sleeves and use of sunscreen if there is significant sun exposure.   Diet: the importance of a healthy diet is discussed. They do have a healthy diet.  The benefits of regular aerobic exercise were discussed. She walks 4 times per week ,  20 minutes.   Depression screen: there are no signs or vegative symptoms of depression- irritability, change in appetite, anhedonia, sadness/tearfullness.   The following portions of the patient's history were reviewed and updated as appropriate: allergies, current medications, past family history, past medical history,  past surgical history, past social history  and problem list.  Visual acuity was not assessed per patient preference since she has regular follow up with her ophthalmologist. Hearing and body mass index were assessed and reviewed.   During the course of the visit the patient was educated and counseled about appropriate screening and preventive services  including : fall prevention , diabetes screening, nutrition counseling, colorectal cancer screening, and recommended immunizations.    CC: The primary encounter diagnosis was Vitamin D deficiency. Diagnoses of Fatigue, unspecified type, Need for prophylactic vaccination or inoculation against diphtheria and tetanus, Encounter for preventive health examination, Atypical ductal hyperplasia of left breast, and Breast cancer screening, high risk patient were also pertinent to this visit.  History Robin Riley has a past medical history of Acne; Adrenal tumor (2008); Chest pain (2008); Gastritis (Sept 2008); and L4 vertebral fracture ([place]).   She has a past surgical history that includes Endometrial ablation (2012); Tonsillectomy; and Breast lumpectomy with radioactive seed localization (Left, 10/04/2014).   Her family history includes Cancer in her maternal aunt; Diabetes in her maternal grandmother; Heart disease in her maternal grandfather; Hypertension in her mother; Osteoporosis in her maternal grandmother and mother.She reports that she has never smoked. She has never used smokeless tobacco. She reports that she does not drink alcohol or use drugs.  Outpatient Medications Prior to Visit  Medication Sig Dispense Refill  . calcium citrate-vitamin D 200-200 MG-UNIT TABS Take 1 tablet by mouth daily.    . cyanocobalamin (,VITAMIN B-12,) 1000 MCG/ML injection Inject 1 mL (1,000 mcg total) into the muscle every 30 (thirty) days. 10 mL 0  . cyanocobalamin (,VITAMIN B-12,) 1000 MCG/ML injection Inject 1 mL (1,000 mcg total) into the muscle every 30 (thirty) days. 12 mL 1  . ibuprofen (ADVIL,MOTRIN) 200 MG tablet Take 200 mg by mouth as needed.      Marland Kitchen  Multiple Vitamin (MULTIVITAMIN) tablet Take 1 tablet by mouth daily.      . ciprofloxacin (CIPRO) 250 MG tablet Take 1 tablet (250 mg total) by mouth 2 (two) times daily. (Patient not taking: Reported on 01/09/2016) 10 tablet 0  . dexlansoprazole (DEXILANT) 60 MG  capsule Take 1 capsule (60 mg total) by mouth daily. (Patient not taking: Reported on 01/09/2016) 90 capsule 1  . fluconazole (DIFLUCAN) 150 MG tablet Take 1 tablet (150 mg total) by mouth daily. (Patient not taking: Reported on 01/09/2016) 2 tablet 0  . spironolactone (ALDACTONE) 25 MG tablet Take 1 tablet (25 mg total) by mouth daily. As needed for fluid retention (Patient not taking: Reported on 01/09/2016) 30 tablet 0   No facility-administered medications prior to visit.     Review of Systems   Patient denies headache, fevers, malaise, unintentional weight loss, skin rash, eye pain, sinus congestion and sinus pain, sore throat, dysphagia,  hemoptysis , cough, dyspnea, wheezing, chest pain, palpitations, orthopnea, edema, abdominal pain, nausea, melena, diarrhea, constipation, flank pain, dysuria, hematuria, urinary  Frequency, nocturia, numbness, tingling, seizures,  Focal weakness, Loss of consciousness,  Tremor, insomnia, depression, anxiety, and suicidal ideation.      Objective:  BP 98/68 (BP Location: Left Arm, Patient Position: Sitting, Cuff Size: Normal)   Pulse 90   Temp 98 F (36.7 C)   Resp 16   Ht 5' 2.75" (1.594 m)   Wt 136 lb 6 oz (61.9 kg)   SpO2 97%   BMI 24.35 kg/m   Physical Exam   General appearance: alert, cooperative and appears stated age Head: Normocephalic, without obvious abnormality, atraumatic Eyes: conjunctivae/corneas clear. PERRL, EOM's intact. Fundi benign. Ears: normal TM's and external ear canals both ears Nose: Nares normal. Septum midline. Mucosa normal. No drainage or sinus tenderness. Throat: lips, mucosa, and tongue normal; teeth and gums normal Neck: no adenopathy, no carotid bruit, no JVD, supple, symmetrical, trachea midline and thyroid not enlarged, symmetric, no tenderness/mass/nodules Lungs: clear to auscultation bilaterally Breasts: normal appearance, no masses or tenderness Heart: regular rate and rhythm, S1, S2 normal, no murmur,  click, rub or gallop Abdomen: soft, non-tender; bowel sounds normal; no masses,  no organomegaly Extremities: extremities normal, atraumatic, no cyanosis or edema Pulses: 2+ and symmetric Skin: Skin color, texture, turgor normal. No rashes or lesions Neurologic: Alert and oriented X 3, normal strength and tone. Normal symmetric reflexes. Normal coordination and gait.      Assessment & Plan:   Problem List Items Addressed This Visit    Encounter for preventive health examination    Annual comprehensive preventive exam was done as well as an evaluation and management of chronic conditions .  During the course of the visit the patient was educated and counseled about appropriate screening and preventive services including :  diabetes screening, lipid analysis with projected  10 year  risk for CAD , nutrition counseling, breast, cervical and colorectal cancer screening, and recommended immunizations.  Printed recommendations for health maintenance screenings was given.  Lab Results  Component Value Date   CHOL 177 12/26/2014   HDL 63.90 12/26/2014   LDLCALC 107 (H) 12/26/2014   TRIG 30.0 12/26/2014   CHOLHDL 3 12/26/2014   Lab Results  Component Value Date   TSH 1.07 01/09/2016   Lab Results  Component Value Date   ALT 22 01/09/2016   AST 20 01/09/2016   ALKPHOS 54 01/09/2016   BILITOT 0.5 01/09/2016   Lab Results  Component Value Date  NA 139 01/09/2016   K 4.3 01/09/2016   CL 105 01/09/2016   CO2 28 01/09/2016         Atypical ductal hyperplasia of left breast    By breast biopsy 2016.  Annual surveillance mammography in the [facility] advised.        Other Visit Diagnoses    Vitamin D deficiency    -  Primary   Relevant Orders   VITAMIN D 25 Hydroxy (Vit-D Deficiency, Fractures) (Completed)   Fatigue, unspecified type       Relevant Orders   TSH (Completed)   Comprehensive metabolic panel (Completed)   Need for prophylactic vaccination or  inoculation against diphtheria and tetanus       Relevant Orders   Tdap vaccine greater than or equal to 7yo IM (Completed)   Breast cancer screening, high risk patient       Relevant Orders   MM DIGITAL SCREENING BILATERAL      I am having Ms. Edwinna Areola maintain her ibuprofen, multivitamin, calcium citrate-vitamin D, dexlansoprazole, spironolactone, ciprofloxacin, fluconazole, cyanocobalamin, and cyanocobalamin.  No orders of the defined types were placed in this encounter.   There are no discontinued medications.  Follow-up: No Follow-up on file.   Crecencio Mc, MD

## 2016-01-09 NOTE — Patient Instructions (Addendum)
Dig[employer]age by [employer]   (chocolate probiotics !)  Or [employer]   (carbonated probiotic beverage;  tastes great ,  Dentist and HT have the best prices )  Health Maintenance, Female Adopting a healthy lifestyle and getting preventive care can go a long way to promote health and wellness. Talk with your health care provider about what schedule of regular examinations is right for you. This is a good chance for you to check in with your provider about disease prevention and staying healthy. In between checkups, there are plenty of things you can do on your own. Experts have done a lot of research about which lifestyle changes and preventive measures are most likely to keep you healthy. Ask your health care provider for more information. WEIGHT AND DIET  Eat a healthy diet  Be sure to include plenty of vegetables, fruits, low-fat dairy products, and lean protein.  Do not eat a lot of foods high in solid fats, added sugars, or salt.  Get regular exercise. This is one of the most important things you can do for your health.  Most adults should exercise for at least 150 minutes each week. The exercise should increase your heart rate and make you sweat (moderate-intensity exercise).  Most adults should also do strengthening exercises at least twice a week. This is in addition to the moderate-intensity exercise.  Maintain a healthy weight  Body mass index (BMI) is a measurement that can be used to identify possible weight problems. It estimates body fat based on height and weight. Your health care provider can help determine your BMI and help you achieve or maintain a healthy weight.  For females 42 years of age and older:   A BMI below 18.5 is considered underweight.  A BMI of 18.5 to 24.9 is normal.  A BMI of 25 to 29.9 is considered overweight.  A BMI of 30 and above is considered obese.  Watch levels of cholesterol and blood lipids  You should start having your blood tested for  lipids and cholesterol at 42 years of age, then have this test every 5 years.  You may need to have your cholesterol levels checked more often if:  Your lipid or cholesterol levels are high.  You are older than 42 years of age.  You are at high risk for heart disease.  CANCER SCREENING   Lung Cancer  Lung cancer screening is recommended for adults 23-42 years old who are at high risk for lung cancer because of a history of smoking.  A yearly low-dose CT scan of the lungs is recommended for people who:  Currently smoke.  Have quit within the past 15 years.  Have at least a 30-pack-year history of smoking. A pack year is smoking an average of one pack of cigarettes a day for 1 year.  Yearly screening should continue until it has been 15 years since you quit.  Yearly screening should stop if you develop a health problem that would prevent you from having lung cancer treatment.  Breast Cancer  Practice breast self-awareness. This means understanding how your breasts normally appear and feel.  It also means doing regular breast self-exams. Let your health care provider know about any changes, no matter how small.  If you are in your 20s or 30s, you should have a clinical breast exam (CBE) by a health care provider every 1-3 years as part of a regular health exam.  If you are 5 or older, have a CBE every  year. Also consider having a breast X-ray (mammogram) every year.  If you have a family history of breast cancer, talk to your health care provider about genetic screening.  If you are at high risk for breast cancer, talk to your health care provider about having an MRI and a mammogram every year.  Breast cancer gene (BRCA) assessment is recommended for women who have family members with BRCA-related cancers. BRCA-related cancers include:  Breast.  Ovarian.  Tubal.  Peritoneal cancers.  Results of the assessment will determine the need for genetic counseling and BRCA1  and BRCA2 testing. Cervical Cancer Your health care provider may recommend that you be screened regularly for cancer of the pelvic organs (ovaries, uterus, and vagina). This screening involves a pelvic examination, including checking for microscopic changes to the surface of your cervix (Pap test). You may be encouraged to have this screening done every 3 years, beginning at age 42.  For women ages 48-65, health care providers may recommend pelvic exams and Pap testing every 3 years, or they may recommend the Pap and pelvic exam, combined with testing for human papilloma virus (HPV), every 5 years. Some types of HPV increase your risk of cervical cancer. Testing for HPV may also be done on women of any age with unclear Pap test results.  Other health care providers may not recommend any screening for nonpregnant women who are considered low risk for pelvic cancer and who do not have symptoms. Ask your health care provider if a screening pelvic exam is right for you.  If you have had past treatment for cervical cancer or a condition that could lead to cancer, you need Pap tests and screening for cancer for at least 20 years after your treatment. If Pap tests have been discontinued, your risk factors (such as having a new sexual partner) need to be reassessed to determine if screening should resume. Some women have medical problems that increase the chance of getting cervical cancer. In these cases, your health care provider may recommend more frequent screening and Pap tests. Colorectal Cancer  This type of cancer can be detected and often prevented.  Routine colorectal cancer screening usually begins at 42 years of age and continues through 42 years of age.  Your health care provider may recommend screening at an earlier age if you have risk factors for colon cancer.  Your health care provider may also recommend using home test kits to check for hidden blood in the stool.  A small camera at the  end of a tube can be used to examine your colon directly (sigmoidoscopy or colonoscopy). This is done to check for the earliest forms of colorectal cancer.  Routine screening usually begins at age 45.  Direct examination of the colon should be repeated every 5-10 years through 42 years of age. However, you may need to be screened more often if early forms of precancerous polyps or small growths are found. Skin Cancer  Check your skin from head to toe regularly.  Tell your health care provider about any new moles or changes in moles, especially if there is a change in a mole's shape or color.  Also tell your health care provider if you have a mole that is larger than the size of a pencil eraser.  Always use sunscreen. Apply sunscreen liberally and repeatedly throughout the day.  Protect yourself by wearing long sleeves, pants, a wide-brimmed hat, and sunglasses whenever you are outside. HEART DISEASE, DIABETES, AND HIGH BLOOD PRESSURE  High blood pressure causes heart disease and increases the risk of stroke. High blood pressure is more likely to develop in:  People who have blood pressure in the high end of the normal range (130-139/85-89 mm Hg).  People who are overweight or obese.  People who are African American.  If you are 18-39 years of age, have your blood pressure checked every 3-5 years. If you are 40 years of age or older, have your blood pressure checked every year. You should have your blood pressure measured twice--once when you are at a hospital or clinic, and once when you are not at a hospital or clinic. Record the average of the two measurements. To check your blood pressure when you are not at a hospital or clinic, you can use:  An automated blood pressure machine at a pharmacy.  A home blood pressure monitor.  If you are between 55 years and 79 years old, ask your health care provider if you should take aspirin to prevent strokes.  Have regular diabetes  screenings. This involves taking a blood sample to check your fasting blood sugar level.  If you are at a normal weight and have a low risk for diabetes, have this test once every three years after 42 years of age.  If you are overweight and have a high risk for diabetes, consider being tested at a younger age or more often. PREVENTING INFECTION  Hepatitis B  If you have a higher risk for hepatitis B, you should be screened for this virus. You are considered at high risk for hepatitis B if:  You were born in a country where hepatitis B is common. Ask your health care provider which countries are considered high risk.  Your parents were born in a high-risk country, and you have not been immunized against hepatitis B (hepatitis B vaccine).  You have HIV or AIDS.  You use needles to inject street drugs.  You live with someone who has hepatitis B.  You have had sex with someone who has hepatitis B.  You get hemodialysis treatment.  You take certain medicines for conditions, including cancer, organ transplantation, and autoimmune conditions. Hepatitis C  Blood testing is recommended for:  Everyone born from 1945 through 1965.  Anyone with known risk factors for hepatitis C. Sexually transmitted infections (STIs)  You should be screened for sexually transmitted infections (STIs) including gonorrhea and chlamydia if:  You are sexually active and are younger than 42 years of age.  You are older than 42 years of age and your health care provider tells you that you are at risk for this type of infection.  Your sexual activity has changed since you were last screened and you are at an increased risk for chlamydia or gonorrhea. Ask your health care provider if you are at risk.  If you do not have HIV, but are at risk, it may be recommended that you take a prescription medicine daily to prevent HIV infection. This is called pre-exposure prophylaxis (PrEP). You are considered at risk  if:  You are sexually active and do not regularly use condoms or know the HIV status of your partner(s).  You take drugs by injection.  You are sexually active with a partner who has HIV. Talk with your health care provider about whether you are at high risk of being infected with HIV. If you choose to begin PrEP, you should first be tested for HIV. You should then be tested every 3 months for as   long as you are taking PrEP.  PREGNANCY   If you are premenopausal and you may become pregnant, ask your health care provider about preconception counseling.  If you may become pregnant, take 400 to 800 micrograms (mcg) of folic acid every day.  If you want to prevent pregnancy, talk to your health care provider about birth control (contraception). OSTEOPOROSIS AND MENOPAUSE   Osteoporosis is a disease in which the bones lose minerals and strength with aging. This can result in serious bone fractures. Your risk for osteoporosis can be identified using a bone density scan.  If you are 60 years of age or older, or if you are at risk for osteoporosis and fractures, ask your health care provider if you should be screened.  Ask your health care provider whether you should take a calcium or vitamin D supplement to lower your risk for osteoporosis.  Menopause may have certain physical symptoms and risks.  Hormone replacement therapy may reduce some of these symptoms and risks. Talk to your health care provider about whether hormone replacement therapy is right for you.  HOME CARE INSTRUCTIONS   Schedule regular health, dental, and eye exams.  Stay current with your immunizations.   Do not use any tobacco products including cigarettes, chewing tobacco, or electronic cigarettes.  If you are pregnant, do not drink alcohol.  If you are breastfeeding, limit how much and how often you drink alcohol.  Limit alcohol intake to no more than 1 drink per day for nonpregnant women. One drink equals 12  ounces of beer, 5 ounces of wine, or 1 ounces of hard liquor.  Do not use street drugs.  Do not share needles.  Ask your health care provider for help if you need support or information about quitting drugs.  Tell your health care provider if you often feel depressed.  Tell your health care provider if you have ever been abused or do not feel safe at home.   This information is not intended to replace advice given to you by your health care provider. Make sure you discuss any questions you have with your health care provider.   Document Released: 09/01/2010 Document Revised: 03/09/2014 Document Reviewed: 01/18/2013 Elsevier Interactive Patient Education [employer].

## 2016-01-09 NOTE — Progress Notes (Signed)
Pre visit review using our clinic review tool, if applicable. No additional management support is needed unless otherwise documented below in the visit note. 

## 2016-01-10 ENCOUNTER — Encounter: Payer: Self-pay | Admitting: Internal Medicine

## 2016-01-12 DIAGNOSIS — N6092 Unspecified benign mammary dysplasia of left breast: Secondary | ICD-10-CM | POA: Insufficient documentation

## 2016-01-12 NOTE — Assessment & Plan Note (Signed)
Annual comprehensive preventive exam was done as well as an evaluation and management of chronic conditions .  During the course of the visit the patient was educated and counseled about appropriate screening and preventive services including :  diabetes screening, lipid analysis with projected  10 year  risk for CAD , nutrition counseling, breast, cervical and colorectal cancer screening, and recommended immunizations.  Printed recommendations for health maintenance screenings was given.  Lab Results  Component Value Date   CHOL 177 12/26/2014   HDL 63.90 12/26/2014   LDLCALC 107 (H) 12/26/2014   TRIG 30.0 12/26/2014   CHOLHDL 3 12/26/2014   Lab Results  Component Value Date   TSH 1.07 01/09/2016   Lab Results  Component Value Date   ALT 22 01/09/2016   AST 20 01/09/2016   ALKPHOS 54 01/09/2016   BILITOT 0.5 01/09/2016   Lab Results  Component Value Date   NA 139 01/09/2016   K 4.3 01/09/2016   CL 105 01/09/2016   CO2 28 01/09/2016

## 2016-01-12 NOTE — Assessment & Plan Note (Signed)
By breast biopsy 2016.  Annual surveillance mammography in the [facility] advised.

## 2016-01-15 DIAGNOSIS — D1800 Hemangioma unspecified site: Secondary | ICD-10-CM | POA: Diagnosis not present

## 2016-01-15 DIAGNOSIS — D229 Melanocytic nevi, unspecified: Secondary | ICD-10-CM | POA: Diagnosis not present

## 2016-01-15 DIAGNOSIS — L821 Other seborrheic keratosis: Secondary | ICD-10-CM | POA: Diagnosis not present

## 2016-01-15 DIAGNOSIS — D2272 Melanocytic nevi of left lower limb, including hip: Secondary | ICD-10-CM | POA: Diagnosis not present

## 2016-01-15 DIAGNOSIS — Z1283 Encounter for screening for malignant neoplasm of skin: Secondary | ICD-10-CM | POA: Diagnosis not present

## 2016-01-15 DIAGNOSIS — L812 Freckles: Secondary | ICD-10-CM | POA: Diagnosis not present

## 2016-01-15 DIAGNOSIS — D485 Neoplasm of uncertain behavior of skin: Secondary | ICD-10-CM | POA: Diagnosis not present

## 2016-03-03 DIAGNOSIS — Z6829 Body mass index (BMI) 29.0-29.9, adult: Secondary | ICD-10-CM | POA: Diagnosis not present

## 2016-03-03 DIAGNOSIS — Z01419 Encounter for gynecological examination (general) (routine) without abnormal findings: Secondary | ICD-10-CM | POA: Diagnosis not present

## 2016-03-03 DIAGNOSIS — Z1231 Encounter for screening mammogram for malignant neoplasm of breast: Secondary | ICD-10-CM | POA: Diagnosis not present

## 2016-09-07 DIAGNOSIS — H5213 Myopia, bilateral: Secondary | ICD-10-CM | POA: Diagnosis not present

## 2016-09-07 DIAGNOSIS — H43313 Vitreous membranes and strands, bilateral: Secondary | ICD-10-CM | POA: Diagnosis not present

## 2016-09-28 DIAGNOSIS — H16302 Unspecified interstitial keratitis, left eye: Secondary | ICD-10-CM | POA: Diagnosis not present

## 2016-10-05 DIAGNOSIS — N6092 Unspecified benign mammary dysplasia of left breast: Secondary | ICD-10-CM | POA: Diagnosis not present

## 2016-10-06 DIAGNOSIS — H1132 Conjunctival hemorrhage, left eye: Secondary | ICD-10-CM | POA: Diagnosis not present

## 2016-11-09 ENCOUNTER — Encounter: Payer: Self-pay | Admitting: Internal Medicine

## 2017-01-14 ENCOUNTER — Encounter: Payer: 59 | Admitting: Internal Medicine

## 2017-01-25 DIAGNOSIS — M9903 Segmental and somatic dysfunction of lumbar region: Secondary | ICD-10-CM | POA: Diagnosis not present

## 2017-01-25 DIAGNOSIS — M4316 Spondylolisthesis, lumbar region: Secondary | ICD-10-CM | POA: Diagnosis not present

## 2017-01-25 DIAGNOSIS — M5011 Cervical disc disorder with radiculopathy,  high cervical region: Secondary | ICD-10-CM | POA: Diagnosis not present

## 2017-01-25 DIAGNOSIS — M9901 Segmental and somatic dysfunction of cervical region: Secondary | ICD-10-CM | POA: Diagnosis not present

## 2017-01-26 DIAGNOSIS — M9901 Segmental and somatic dysfunction of cervical region: Secondary | ICD-10-CM | POA: Diagnosis not present

## 2017-01-26 DIAGNOSIS — M9903 Segmental and somatic dysfunction of lumbar region: Secondary | ICD-10-CM | POA: Diagnosis not present

## 2017-01-26 DIAGNOSIS — M4316 Spondylolisthesis, lumbar region: Secondary | ICD-10-CM | POA: Diagnosis not present

## 2017-01-26 DIAGNOSIS — M5011 Cervical disc disorder with radiculopathy,  high cervical region: Secondary | ICD-10-CM | POA: Diagnosis not present

## 2017-01-27 DIAGNOSIS — M4316 Spondylolisthesis, lumbar region: Secondary | ICD-10-CM | POA: Diagnosis not present

## 2017-01-27 DIAGNOSIS — M9901 Segmental and somatic dysfunction of cervical region: Secondary | ICD-10-CM | POA: Diagnosis not present

## 2017-01-27 DIAGNOSIS — M9903 Segmental and somatic dysfunction of lumbar region: Secondary | ICD-10-CM | POA: Diagnosis not present

## 2017-01-27 DIAGNOSIS — M5011 Cervical disc disorder with radiculopathy,  high cervical region: Secondary | ICD-10-CM | POA: Diagnosis not present

## 2017-02-01 ENCOUNTER — Encounter: Payer: 59 | Admitting: Internal Medicine

## 2017-02-03 DIAGNOSIS — M5011 Cervical disc disorder with radiculopathy,  high cervical region: Secondary | ICD-10-CM | POA: Diagnosis not present

## 2017-02-03 DIAGNOSIS — M9901 Segmental and somatic dysfunction of cervical region: Secondary | ICD-10-CM | POA: Diagnosis not present

## 2017-02-03 DIAGNOSIS — M9903 Segmental and somatic dysfunction of lumbar region: Secondary | ICD-10-CM | POA: Diagnosis not present

## 2017-02-03 DIAGNOSIS — M4316 Spondylolisthesis, lumbar region: Secondary | ICD-10-CM | POA: Diagnosis not present

## 2017-02-04 DIAGNOSIS — M4316 Spondylolisthesis, lumbar region: Secondary | ICD-10-CM | POA: Diagnosis not present

## 2017-02-04 DIAGNOSIS — M9903 Segmental and somatic dysfunction of lumbar region: Secondary | ICD-10-CM | POA: Diagnosis not present

## 2017-02-04 DIAGNOSIS — M5011 Cervical disc disorder with radiculopathy,  high cervical region: Secondary | ICD-10-CM | POA: Diagnosis not present

## 2017-02-04 DIAGNOSIS — M9901 Segmental and somatic dysfunction of cervical region: Secondary | ICD-10-CM | POA: Diagnosis not present

## 2017-02-09 DIAGNOSIS — M5011 Cervical disc disorder with radiculopathy,  high cervical region: Secondary | ICD-10-CM | POA: Diagnosis not present

## 2017-02-09 DIAGNOSIS — M9903 Segmental and somatic dysfunction of lumbar region: Secondary | ICD-10-CM | POA: Diagnosis not present

## 2017-02-09 DIAGNOSIS — M4316 Spondylolisthesis, lumbar region: Secondary | ICD-10-CM | POA: Diagnosis not present

## 2017-02-09 DIAGNOSIS — M9901 Segmental and somatic dysfunction of cervical region: Secondary | ICD-10-CM | POA: Diagnosis not present

## 2017-02-17 DIAGNOSIS — M9901 Segmental and somatic dysfunction of cervical region: Secondary | ICD-10-CM | POA: Diagnosis not present

## 2017-02-17 DIAGNOSIS — M4316 Spondylolisthesis, lumbar region: Secondary | ICD-10-CM | POA: Diagnosis not present

## 2017-02-17 DIAGNOSIS — M5011 Cervical disc disorder with radiculopathy,  high cervical region: Secondary | ICD-10-CM | POA: Diagnosis not present

## 2017-02-17 DIAGNOSIS — M9903 Segmental and somatic dysfunction of lumbar region: Secondary | ICD-10-CM | POA: Diagnosis not present

## 2017-02-26 DIAGNOSIS — M5011 Cervical disc disorder with radiculopathy,  high cervical region: Secondary | ICD-10-CM | POA: Diagnosis not present

## 2017-02-26 DIAGNOSIS — M4316 Spondylolisthesis, lumbar region: Secondary | ICD-10-CM | POA: Diagnosis not present

## 2017-02-26 DIAGNOSIS — M9903 Segmental and somatic dysfunction of lumbar region: Secondary | ICD-10-CM | POA: Diagnosis not present

## 2017-02-26 DIAGNOSIS — M9901 Segmental and somatic dysfunction of cervical region: Secondary | ICD-10-CM | POA: Diagnosis not present

## 2017-03-15 DIAGNOSIS — Z1231 Encounter for screening mammogram for malignant neoplasm of breast: Secondary | ICD-10-CM | POA: Diagnosis not present

## 2017-03-15 DIAGNOSIS — Z6822 Body mass index (BMI) 22.0-22.9, adult: Secondary | ICD-10-CM | POA: Diagnosis not present

## 2017-03-15 DIAGNOSIS — Z01419 Encounter for gynecological examination (general) (routine) without abnormal findings: Secondary | ICD-10-CM | POA: Diagnosis not present

## 2017-03-17 ENCOUNTER — Encounter: Payer: Self-pay | Admitting: Internal Medicine

## 2017-03-17 ENCOUNTER — Ambulatory Visit (INDEPENDENT_AMBULATORY_CARE_PROVIDER_SITE_OTHER): Payer: 59 | Admitting: Internal Medicine

## 2017-03-17 VITALS — BP 130/72 | HR 86 | Temp 98.1°F | Resp 15 | Ht 62.75 in | Wt 126.6 lb

## 2017-03-17 DIAGNOSIS — E785 Hyperlipidemia, unspecified: Secondary | ICD-10-CM

## 2017-03-17 DIAGNOSIS — Z0000 Encounter for general adult medical examination without abnormal findings: Secondary | ICD-10-CM | POA: Diagnosis not present

## 2017-03-17 DIAGNOSIS — E559 Vitamin D deficiency, unspecified: Secondary | ICD-10-CM

## 2017-03-17 DIAGNOSIS — E538 Deficiency of other specified B group vitamins: Secondary | ICD-10-CM | POA: Diagnosis not present

## 2017-03-17 DIAGNOSIS — D497 Neoplasm of unspecified behavior of endocrine glands and other parts of nervous system: Secondary | ICD-10-CM | POA: Diagnosis not present

## 2017-03-17 DIAGNOSIS — Z630 Problems in relationship with spouse or partner: Secondary | ICD-10-CM

## 2017-03-17 DIAGNOSIS — N6092 Unspecified benign mammary dysplasia of left breast: Secondary | ICD-10-CM

## 2017-03-17 DIAGNOSIS — Z8781 Personal history of (healed) traumatic fracture: Secondary | ICD-10-CM

## 2017-03-17 DIAGNOSIS — R5383 Other fatigue: Secondary | ICD-10-CM | POA: Diagnosis not present

## 2017-03-17 MED ORDER — CYANOCOBALAMIN 1000 MCG/ML IJ SOLN
1000.0000 ug | INTRAMUSCULAR | 1 refills | Status: DC
Start: 2017-03-17 — End: 2017-08-27

## 2017-03-17 NOTE — Patient Instructions (Signed)
You might want to try a premixed protein drink called Premier Protein shake for breakfast or late night snack . It is great tasting,   very low sugar and available of < $2 serving at [facility] and  In bulk for $1.50/serving at [employer] and [employer]  .    Nutritional analysis :  160 cal  30 g protein  1 g sugar 50% calcium needs   [employer] and [employer]  I have ordered the labs we discussed, including B12 and intrinsic factor   Health Maintenance, Female Adopting a healthy lifestyle and getting preventive care can go a long way to promote health and wellness. Talk with your health care provider about what schedule of regular examinations is right for you. This is a good chance for you to check in with your provider about disease prevention and staying healthy. In between checkups, there are plenty of things you can do on your own. Experts have done a lot of research about which lifestyle changes and preventive measures are most likely to keep you healthy. Ask your health care provider for more information. Weight and diet Eat a healthy diet  Be sure to include plenty of vegetables, fruits, low-fat dairy products, and lean protein.  Do not eat a lot of foods high in solid fats, added sugars, or salt.  Get regular exercise. This is one of the most important things you can do for your health. ? Most adults should exercise for at least 150 minutes each week. The exercise should increase your heart rate and make you sweat (moderate-intensity exercise). ? Most adults should also do strengthening exercises at least twice a week. This is in addition to the moderate-intensity exercise.  Maintain a healthy weight  Body mass index (BMI) is a measurement that can be used to identify possible weight problems. It estimates body fat based on height and weight. Your health care provider can help determine your BMI and help you achieve or maintain a healthy weight.  For females 53 years of age and older: ? A BMI  below 18.5 is considered underweight. ? A BMI of 18.5 to 24.9 is normal. ? A BMI of 25 to 29.9 is considered overweight. ? A BMI of 30 and above is considered obese.  Watch levels of cholesterol and blood lipids  You should start having your blood tested for lipids and cholesterol at 44 years of age, then have this test every 5 years.  You may need to have your cholesterol levels checked more often if: ? Your lipid or cholesterol levels are high. ? You are older than 44 years of age. ? You are at high risk for heart disease.  Cancer screening Lung Cancer  Lung cancer screening is recommended for adults 52-23 years old who are at high risk for lung cancer because of a history of smoking.  A yearly low-dose CT scan of the lungs is recommended for people who: ? Currently smoke. ? Have quit within the past 15 years. ? Have at least a 30-pack-year history of smoking. A pack year is smoking an average of one pack of cigarettes a day for 1 year.  Yearly screening should continue until it has been 15 years since you quit.  Yearly screening should stop if you develop a health problem that would prevent you from having lung cancer treatment.  Breast Cancer  Practice breast self-awareness. This means understanding how your breasts normally appear and feel.  It also means doing regular breast self-exams.  Let your health care provider know about any changes, no matter how small.  If you are in your 20s or 30s, you should have a clinical breast exam (CBE) by a health care provider every 1-3 years as part of a regular health exam.  If you are 52 or older, have a CBE every year. Also consider having a breast X-ray (mammogram) every year.  If you have a family history of breast cancer, talk to your health care provider about genetic screening.  If you are at high risk for breast cancer, talk to your health care provider about having an MRI and a mammogram every year.  Breast cancer gene  (BRCA) assessment is recommended for women who have family members with BRCA-related cancers. BRCA-related cancers include: ? Breast. ? Ovarian. ? Tubal. ? Peritoneal cancers.  Results of the assessment will determine the need for genetic counseling and BRCA1 and BRCA2 testing.  Cervical Cancer Your health care provider may recommend that you be screened regularly for cancer of the pelvic organs (ovaries, uterus, and vagina). This screening involves a pelvic examination, including checking for microscopic changes to the surface of your cervix (Pap test). You may be encouraged to have this screening done every 3 years, beginning at age 60.  For women ages 80-65, health care providers may recommend pelvic exams and Pap testing every 3 years, or they may recommend the Pap and pelvic exam, combined with testing for human papilloma virus (HPV), every 5 years. Some types of HPV increase your risk of cervical cancer. Testing for HPV may also be done on women of any age with unclear Pap test results.  Other health care providers may not recommend any screening for nonpregnant women who are considered low risk for pelvic cancer and who do not have symptoms. Ask your health care provider if a screening pelvic exam is right for you.  If you have had past treatment for cervical cancer or a condition that could lead to cancer, you need Pap tests and screening for cancer for at least 20 years after your treatment. If Pap tests have been discontinued, your risk factors (such as having a new sexual partner) need to be reassessed to determine if screening should resume. Some women have medical problems that increase the chance of getting cervical cancer. In these cases, your health care provider may recommend more frequent screening and Pap tests.  Colorectal Cancer  This type of cancer can be detected and often prevented.  Routine colorectal cancer screening usually begins at 44 years of age and continues  through 44 years of age.  Your health care provider may recommend screening at an earlier age if you have risk factors for colon cancer.  Your health care provider may also recommend using home test kits to check for hidden blood in the stool.  A small camera at the end of a tube can be used to examine your colon directly (sigmoidoscopy or colonoscopy). This is done to check for the earliest forms of colorectal cancer.  Routine screening usually begins at age 69.  Direct examination of the colon should be repeated every 5-10 years through 44 years of age. However, you may need to be screened more often if early forms of precancerous polyps or small growths are found.  Skin Cancer  Check your skin from head to toe regularly.  Tell your health care provider about any new moles or changes in moles, especially if there is a change in a mole's shape or color.  Also tell your health care provider if you have a mole that is larger than the size of a pencil eraser.  Always use sunscreen. Apply sunscreen liberally and repeatedly throughout the day.  Protect yourself by wearing long sleeves, pants, a wide-brimmed hat, and sunglasses whenever you are outside.  Heart disease, diabetes, and high blood pressure  High blood pressure causes heart disease and increases the risk of stroke. High blood pressure is more likely to develop in: ? People who have blood pressure in the high end of the normal range (130-139/85-89 mm Hg). ? People who are overweight or obese. ? People who are African American.  If you are 65-71 years of age, have your blood pressure checked every 3-5 years. If you are 15 years of age or older, have your blood pressure checked every year. You should have your blood pressure measured twice-once when you are at a hospital or clinic, and once when you are not at a hospital or clinic. Record the average of the two measurements. To check your blood pressure when you are not at a  hospital or clinic, you can use: ? An automated blood pressure machine at a pharmacy. ? A home blood pressure monitor.  If you are between 66 years and 68 years old, ask your health care provider if you should take aspirin to prevent strokes.  Have regular diabetes screenings. This involves taking a blood sample to check your fasting blood sugar level. ? If you are at a normal weight and have a low risk for diabetes, have this test once every three years after 44 years of age. ? If you are overweight and have a high risk for diabetes, consider being tested at a younger age or more often. Preventing infection Hepatitis B  If you have a higher risk for hepatitis B, you should be screened for this virus. You are considered at high risk for hepatitis B if: ? You were born in a country where hepatitis B is common. Ask your health care provider which countries are considered high risk. ? Your parents were born in a high-risk country, and you have not been immunized against hepatitis B (hepatitis B vaccine). ? You have HIV or AIDS. ? You use needles to inject street drugs. ? You live with someone who has hepatitis B. ? You have had sex with someone who has hepatitis B. ? You get hemodialysis treatment. ? You take certain medicines for conditions, including cancer, organ transplantation, and autoimmune conditions.  Hepatitis C  Blood testing is recommended for: ? Everyone born from [birth year] through 1967. ? Anyone with known risk factors for hepatitis C.  Sexually transmitted infections (STIs)  You should be screened for sexually transmitted infections (STIs) including gonorrhea and chlamydia if: ? You are sexually active and are younger than 44 years of age. ? You are older than 44 years of age and your health care provider tells you that you are at risk for this type of infection. ? Your sexual activity has changed since you were last screened and you are at an increased risk for chlamydia or  gonorrhea. Ask your health care provider if you are at risk.  If you do not have HIV, but are at risk, it may be recommended that you take a prescription medicine daily to prevent HIV infection. This is called pre-exposure prophylaxis (PrEP). You are considered at risk if: ? You are sexually active and do not regularly use condoms or know the HIV status of  your partner(s). ? You take drugs by injection. ? You are sexually active with a partner who has HIV.  Talk with your health care provider about whether you are at high risk of being infected with HIV. If you choose to begin PrEP, you should first be tested for HIV. You should then be tested every 3 months for as long as you are taking PrEP. Pregnancy  If you are premenopausal and you may become pregnant, ask your health care provider about preconception counseling.  If you may become pregnant, take 400 to 800 micrograms (mcg) of folic acid every day.  If you want to prevent pregnancy, talk to your health care provider about birth control (contraception). Osteoporosis and menopause  Osteoporosis is a disease in which the bones lose minerals and strength with aging. This can result in serious bone fractures. Your risk for osteoporosis can be identified using a bone density scan.  If you are 13 years of age or older, or if you are at risk for osteoporosis and fractures, ask your health care provider if you should be screened.  Ask your health care provider whether you should take a calcium or vitamin D supplement to lower your risk for osteoporosis.  Menopause may have certain physical symptoms and risks.  Hormone replacement therapy may reduce some of these symptoms and risks. Talk to your health care provider about whether hormone replacement therapy is right for you. Follow these instructions at home:  Schedule regular health, dental, and eye exams.  Stay current with your immunizations.  Do not use any tobacco products including  cigarettes, chewing tobacco, or electronic cigarettes.  If you are pregnant, do not drink alcohol.  If you are breastfeeding, limit how much and how often you drink alcohol.  Limit alcohol intake to no more than 1 drink per day for nonpregnant women. One drink equals 12 ounces of beer, 5 ounces of wine, or 1 ounces of hard liquor.  Do not use street drugs.  Do not share needles.  Ask your health care provider for help if you need support or information about quitting drugs.  Tell your health care provider if you often feel depressed.  Tell your health care provider if you have ever been abused or do not feel safe at home. This information is not intended to replace advice given to you by your health care provider. Make sure you discuss any questions you have with your health care provider. Document Released: 09/01/2010 Document Revised: 07/25/2015 Document Reviewed: 11/20/2014 Elsevier Interactive Patient Education  [employer].
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
## 2017-03-18 ENCOUNTER — Other Ambulatory Visit: Payer: Self-pay | Admitting: Obstetrics & Gynecology

## 2017-03-18 ENCOUNTER — Telehealth: Payer: Self-pay

## 2017-03-18 DIAGNOSIS — Z0000 Encounter for general adult medical examination without abnormal findings: Secondary | ICD-10-CM

## 2017-03-18 DIAGNOSIS — R928 Other abnormal and inconclusive findings on diagnostic imaging of breast: Secondary | ICD-10-CM

## 2017-03-18 DIAGNOSIS — E785 Hyperlipidemia, unspecified: Secondary | ICD-10-CM

## 2017-03-18 DIAGNOSIS — E538 Deficiency of other specified B group vitamins: Secondary | ICD-10-CM

## 2017-03-18 NOTE — Telephone Encounter (Signed)
Copied from [place] #[id number]. Topic: Inquiry >> Mar 18, 2017  8:24 AM Arletha Grippe wrote: Reason for CRM: pt called - she needs the orders that were put in yesterday to go through [facility] so that she can get them drawn at work. Please call [phone]

## 2017-03-18 NOTE — Assessment & Plan Note (Signed)
Annual comprehensive preventive exam was done as well as an evaluation and management of chronic conditions .  During the course of the visit the patient was educated and counseled about appropriate screening and preventive services including :  diabetes screening, lipid analysis with projected  10 year  risk for CAD , nutrition counseling, breast, cervical and colorectal cancer screening, and recommended immunizations.  Printed recommendations for health maintenance screenings was give 

## 2017-03-18 NOTE — Assessment & Plan Note (Signed)
DEXA was ordered twice and never done to investigate bone density after a self reported non traumatic vertebral fracture.  Will check to see if insurance covers procedure.

## 2017-03-18 NOTE — Assessment & Plan Note (Signed)
By breast biopsy 2016.  Annual surveillance mammography in the [facility] has been done with 3d mammography

## 2017-03-18 NOTE — Telephone Encounter (Signed)
Notified pt that labs have been reordered.

## 2017-03-18 NOTE — Assessment & Plan Note (Signed)
Unchanged at 2 yr follow up in 2010.  Considered benign

## 2017-03-18 NOTE — Assessment & Plan Note (Signed)
managed with monthly IM injections by mother who is an Therapist, sports .  She has missed a few months here and there .Checking intrinsic factor  No results found for: VITAMINB12

## 2017-03-18 NOTE — Addendum Note (Signed)
Addended by: Adair Laundry on: 03/18/2017 02:17 PM   Modules accepted: Orders

## 2017-03-19 ENCOUNTER — Other Ambulatory Visit: Payer: 59

## 2017-03-19 DIAGNOSIS — E785 Hyperlipidemia, unspecified: Secondary | ICD-10-CM | POA: Diagnosis not present

## 2017-03-19 DIAGNOSIS — E538 Deficiency of other specified B group vitamins: Secondary | ICD-10-CM | POA: Diagnosis not present

## 2017-03-19 DIAGNOSIS — Z0000 Encounter for general adult medical examination without abnormal findings: Secondary | ICD-10-CM | POA: Diagnosis not present

## 2017-03-22 ENCOUNTER — Encounter: Payer: Self-pay | Admitting: Internal Medicine

## 2017-03-22 LAB — CBC WITH DIFFERENTIAL/PLATELET
BASOS ABS: 0 10*3/uL (ref 0.0–0.2)
Basos: 1 %
EOS (ABSOLUTE): 0 10*3/uL (ref 0.0–0.4)
EOS: 1 %
HEMATOCRIT: 37.2 % (ref 34.0–46.6)
HEMOGLOBIN: 11.9 g/dL (ref 11.1–15.9)
IMMATURE GRANS (ABS): 0 10*3/uL (ref 0.0–0.1)
Immature Granulocytes: 0 %
LYMPHS: 25 %
Lymphocytes Absolute: 1.2 10*3/uL (ref 0.7–3.1)
MCH: 26.6 pg (ref 26.6–33.0)
MCHC: 32 g/dL (ref 31.5–35.7)
MCV: 83 fL (ref 79–97)
MONOCYTES: 8 %
Monocytes Absolute: 0.4 10*3/uL (ref 0.1–0.9)
NEUTROS ABS: 3.3 10*3/uL (ref 1.4–7.0)
Neutrophils: 65 %
Platelets: 215 10*3/uL (ref 150–379)
RBC: 4.48 x10E6/uL (ref 3.77–5.28)
RDW: 14 % (ref 12.3–15.4)
WBC: 5 10*3/uL (ref 3.4–10.8)

## 2017-03-22 LAB — LIPID PANEL
CHOLESTEROL TOTAL: 134 mg/dL (ref 100–199)
Chol/HDL Ratio: 2 ratio (ref 0.0–4.4)
HDL: 67 mg/dL (ref >39)
LDL CALC: 60 mg/dL (ref 0–99)
TRIGLYCERIDES: 37 mg/dL (ref 0–149)
VLDL Cholesterol Cal: 7 mg/dL (ref 5–40)

## 2017-03-22 LAB — COMPREHENSIVE METABOLIC PANEL
ALBUMIN: 4.3 g/dL (ref 3.5–5.5)
ALK PHOS: 53 IU/L (ref 39–117)
ALT: 13 IU/L (ref 0–32)
AST: 17 IU/L (ref 0–40)
Albumin/Globulin Ratio: 2.2 (ref 1.2–2.2)
BUN / CREAT RATIO: 12 (ref 9–23)
BUN: 7 mg/dL (ref 6–24)
Bilirubin Total: 0.4 mg/dL (ref 0.0–1.2)
CO2: 22 mmol/L (ref 20–29)
Calcium: 9 mg/dL (ref 8.7–10.2)
Chloride: 101 mmol/L (ref 96–106)
Creatinine, Ser: 0.6 mg/dL (ref 0.57–1.00)
GFR calc Af Amer: 129 mL/min/{1.73_m2} (ref >59)
GFR, EST NON AFRICAN AMERICAN: 112 mL/min/{1.73_m2} (ref >59)
GLOBULIN, TOTAL: 2 g/dL (ref 1.5–4.5)
Glucose: 77 mg/dL (ref 65–99)
Potassium: 4.1 mmol/L (ref 3.5–5.2)
SODIUM: 139 mmol/L (ref 134–144)
Total Protein: 6.3 g/dL (ref 6.0–8.5)

## 2017-03-22 LAB — TSH: TSH: 1.21 u[IU]/mL (ref 0.450–4.500)

## 2017-03-22 LAB — VITAMIN D 25 HYDROXY (VIT D DEFICIENCY, FRACTURES): VIT D 25 HYDROXY: 35 ng/mL (ref 30.0–100.0)

## 2017-03-22 LAB — INTRINSIC FACTOR ANTIBODIES: INTRINSIC FACTOR ABS, SERUM: 1.1 [AU]/ml (ref 0.0–1.1)

## 2017-03-22 LAB — VITAMIN B12: Vitamin B-12: 415 pg/mL (ref 232–1245)

## 2017-03-23 ENCOUNTER — Encounter: Payer: Self-pay | Admitting: Internal Medicine

## 2017-03-26 ENCOUNTER — Ambulatory Visit
Admission: RE | Admit: 2017-03-26 | Discharge: 2017-03-26 | Disposition: A | Discharge status: Home / Self Care | Payer: 59 | Source: Ambulatory Visit | Attending: Obstetrics & Gynecology | Admitting: Obstetrics & Gynecology

## 2017-03-26 ENCOUNTER — Other Ambulatory Visit: Payer: Self-pay | Admitting: Obstetrics & Gynecology

## 2017-03-26 DIAGNOSIS — N6324 Unspecified lump in the left breast, lower inner quadrant: Secondary | ICD-10-CM | POA: Diagnosis not present

## 2017-03-26 DIAGNOSIS — R928 Other abnormal and inconclusive findings on diagnostic imaging of breast: Secondary | ICD-10-CM

## 2017-03-26 DIAGNOSIS — R921 Mammographic calcification found on diagnostic imaging of breast: Secondary | ICD-10-CM

## 2017-03-29 NOTE — Telephone Encounter (Signed)
Copied from [place] [id number]. Topic: Inquiry >> Mar 29, 2017  4:11 PM Neva Seat wrote: Pt needing to get FMLA started for counseling.  Pt wants to know how to get this started with Dr. Derrel Nip. Please call pt to discuss.

## 2017-03-30 NOTE — Telephone Encounter (Signed)
Please advise 

## 2017-03-31 ENCOUNTER — Ambulatory Visit
Admission: RE | Admit: 2017-03-31 | Discharge: 2017-03-31 | Disposition: A | Discharge status: Home / Self Care | Payer: 59 | Source: Ambulatory Visit | Attending: Obstetrics & Gynecology | Admitting: Obstetrics & Gynecology

## 2017-03-31 ENCOUNTER — Other Ambulatory Visit: Payer: Self-pay | Admitting: Obstetrics & Gynecology

## 2017-03-31 DIAGNOSIS — R921 Mammographic calcification found on diagnostic imaging of breast: Secondary | ICD-10-CM

## 2017-03-31 DIAGNOSIS — N6323 Unspecified lump in the left breast, lower outer quadrant: Secondary | ICD-10-CM | POA: Diagnosis not present

## 2017-03-31 DIAGNOSIS — N6012 Diffuse cystic mastopathy of left breast: Secondary | ICD-10-CM | POA: Diagnosis not present

## 2017-03-31 DIAGNOSIS — N6092 Unspecified benign mammary dysplasia of left breast: Secondary | ICD-10-CM | POA: Diagnosis not present

## 2017-04-01 ENCOUNTER — Other Ambulatory Visit: Payer: Self-pay | Admitting: Obstetrics & Gynecology

## 2017-04-01 DIAGNOSIS — R921 Mammographic calcification found on diagnostic imaging of breast: Secondary | ICD-10-CM

## 2017-04-12 ENCOUNTER — Telehealth: Payer: Self-pay

## 2017-04-12 NOTE — Telephone Encounter (Signed)
Copied from [place] [id number]. Topic: Inquiry >> Apr 12, 2017  3:44 PM Arletha Grippe wrote: Reason for CRM: pt is calling to make sure that fmla form have been received for intermittent FMLA . Please call  [phone] to confirm that they have been received and status of fmla

## 2017-04-13 NOTE — Telephone Encounter (Signed)
Sent pt a mychart message. 

## 2017-04-16 ENCOUNTER — Ambulatory Visit
Admission: RE | Admit: 2017-04-16 | Discharge: 2017-04-16 | Disposition: A | Discharge status: Home / Self Care | Payer: 59 | Source: Ambulatory Visit | Attending: Obstetrics & Gynecology | Admitting: Obstetrics & Gynecology

## 2017-04-16 ENCOUNTER — Other Ambulatory Visit: Payer: Self-pay | Admitting: Obstetrics & Gynecology

## 2017-04-16 DIAGNOSIS — N6012 Diffuse cystic mastopathy of left breast: Secondary | ICD-10-CM | POA: Diagnosis not present

## 2017-04-16 DIAGNOSIS — R921 Mammographic calcification found on diagnostic imaging of breast: Secondary | ICD-10-CM

## 2017-04-21 ENCOUNTER — Encounter: Payer: Self-pay | Admitting: Internal Medicine

## 2017-04-21 DIAGNOSIS — N6092 Unspecified benign mammary dysplasia of left breast: Secondary | ICD-10-CM | POA: Diagnosis not present

## 2017-04-21 DIAGNOSIS — Z630 Problems in relationship with spouse or partner: Secondary | ICD-10-CM | POA: Insufficient documentation

## 2017-04-21 DIAGNOSIS — Z0279 Encounter for issue of other medical certificate: Secondary | ICD-10-CM

## 2017-04-21 DIAGNOSIS — Z635 Disruption of family by separation and divorce: Secondary | ICD-10-CM | POA: Insufficient documentation

## 2017-04-21 NOTE — Assessment & Plan Note (Signed)
Encouraged to attend couselling with husband.  FMLA request will be supported

## 2017-04-30 DIAGNOSIS — N6092 Unspecified benign mammary dysplasia of left breast: Secondary | ICD-10-CM | POA: Diagnosis not present

## 2017-04-30 DIAGNOSIS — N649 Disorder of breast, unspecified: Secondary | ICD-10-CM | POA: Diagnosis not present

## 2017-05-03 ENCOUNTER — Ambulatory Visit: Payer: Self-pay | Admitting: General Surgery

## 2017-05-25 ENCOUNTER — Encounter (HOSPITAL_COMMUNITY): Payer: Self-pay

## 2017-05-25 NOTE — Pre-Procedure Instructions (Signed)
Robin Riley  05/25/2017      [facility], [place] AT [facility] [address] [place] [address] Phone: [phone] Fax: [phone]    Your procedure is scheduled on April 4 .  Report to [facility] Admitting at 900 A.M.  Call this number if you have problems the morning of surgery:  [phone]   Remember:  Do not eat food or drink liquids after midnight.  Take these medicines the morning of surgery with A SIP OF WATER  Tylenol If needed Artifical tears if needed  Stop taking aspirin, BC's, Goody's, Herbal medications, Fish Oil, Vitamins, Ibuprofen, Advil, motrin, aleve    Do not wear jewelry, make-up or nail polish.  Do not wear lotions, powders, or perfumes, or deodorant.  Do not shave 48 hours prior to surgery.  Men may shave face and neck.  Do not bring valuables to the hospital.  [facility] is not responsible for any belongings or valuables.  Contacts, dentures or bridgework may not be worn into surgery.  Leave your suitcase in the car.  After surgery it may be brought to your room.  For patients admitted to the hospital, discharge time will be determined by your treatment team.  Patients discharged the day of surgery will not be allowed to drive home.   Special instructions:   [place]- Preparing For Surgery  Before surgery, you can play an important role. Because skin is not sterile, your skin needs to be as free of germs as possible. You can reduce the number of germs on your skin by washing with CHG (chlorahexidine gluconate) Soap before surgery.  CHG is an antiseptic cleaner which kills germs and bonds with the skin to continue killing germs even after washing.  Please do not use if you have an allergy to CHG or antibacterial soaps. If your skin becomes reddened/irritated stop using the CHG.  Do not shave (including legs and underarms) for at least 48 hours prior to first CHG  shower. It is OK to shave your face.  Please follow these instructions carefully.   1. Shower the NIGHT BEFORE SURGERY and the MORNING OF SURGERY with CHG.   2. If you chose to wash your hair, wash your hair first as usual with your normal shampoo.  3. After you shampoo, rinse your hair and body thoroughly to remove the shampoo.  4. Use CHG as you would any other liquid soap. You can apply CHG directly to the skin and wash gently with a scrungie or a clean washcloth.   5. Apply the CHG Soap to your body ONLY FROM THE NECK DOWN.  Do not use on open wounds or open sores. Avoid contact with your eyes, ears, mouth and genitals (private parts). Wash Face and genitals (private parts)  with your normal soap.  6. Wash thoroughly, paying special attention to the area where your surgery will be performed.  7. Thoroughly rinse your body with warm water from the neck down.  8. DO NOT shower/wash with your normal soap after using and rinsing off the CHG Soap.  9. Pat yourself dry with a CLEAN TOWEL.  10. Wear CLEAN PAJAMAS to bed the night before surgery, wear comfortable clothes the morning of surgery  11. Place CLEAN SHEETS on your bed the night of your first shower and DO NOT SLEEP WITH PETS.    Day of Surgery:  Do not apply any deodorants/lotions. Please wear clean clothes to the hospital/surgery center.      Please read over the following fact sheets that you were given. Pain Booklet, Coughing and Deep Breathing and Surgical Site Infection Prevention

## 2017-05-26 ENCOUNTER — Encounter (HOSPITAL_COMMUNITY)
Admission: RE | Admit: 2017-05-26 | Discharge: 2017-05-26 | Disposition: A | Discharge status: Home / Self Care | Payer: 59 | Source: Ambulatory Visit | Attending: General Surgery | Admitting: General Surgery

## 2017-05-26 ENCOUNTER — Encounter (HOSPITAL_COMMUNITY): Payer: Self-pay

## 2017-05-26 DIAGNOSIS — Z01812 Encounter for preprocedural laboratory examination: Secondary | ICD-10-CM | POA: Diagnosis not present

## 2017-05-26 DIAGNOSIS — N6092 Unspecified benign mammary dysplasia of left breast: Secondary | ICD-10-CM | POA: Insufficient documentation

## 2017-05-26 HISTORY — DX: Malignant (primary) neoplasm, unspecified: C80.1

## 2017-05-26 LAB — CBC
HCT: 36.7 % (ref 36.0–46.0)
HEMOGLOBIN: 11.7 g/dL — AB (ref 12.0–15.0)
MCH: 26.9 pg (ref 26.0–34.0)
MCHC: 31.9 g/dL (ref 30.0–36.0)
MCV: 84.4 fL (ref 78.0–100.0)
PLATELETS: 199 10*3/uL (ref 150–400)
RBC: 4.35 MIL/uL (ref 3.87–5.11)
RDW: 13.7 % (ref 11.5–15.5)
WBC: 5.4 10*3/uL (ref 4.0–10.5)

## 2017-05-26 LAB — BASIC METABOLIC PANEL
ANION GAP: 11 (ref 5–15)
BUN: 12 mg/dL (ref 6–20)
CHLORIDE: 102 mmol/L (ref 101–111)
CO2: 23 mmol/L (ref 22–32)
Calcium: 9.6 mg/dL (ref 8.9–10.3)
Creatinine, Ser: 0.55 mg/dL (ref 0.44–1.00)
GFR calc Af Amer: >60 mL/min (ref >60)
GLUCOSE: 97 mg/dL (ref 65–99)
POTASSIUM: 4 mmol/L (ref 3.5–5.1)
Sodium: 136 mmol/L (ref 135–145)

## 2017-05-26 MED ORDER — CHLORHEXIDINE GLUCONATE CLOTH 2 % EX PADS: 6.0000 | MEDICATED_PAD | Freq: Once | CUTANEOUS | Status: DC

## 2017-05-28 ENCOUNTER — Ambulatory Visit: Payer: Self-pay | Admitting: Plastic Surgery

## 2017-05-28 DIAGNOSIS — C50912 Malignant neoplasm of unspecified site of left female breast: Secondary | ICD-10-CM

## 2017-06-01 ENCOUNTER — Telehealth: Payer: Self-pay | Admitting: Internal Medicine

## 2017-06-01 NOTE — Telephone Encounter (Signed)
LMTCB. PEC may speak with pt.  

## 2017-06-01 NOTE — Telephone Encounter (Signed)
FYI

## 2017-06-01 NOTE — Telephone Encounter (Signed)
Spoke with pt and she stated that she is having surgery on 06/03/2017 and was told to stop all vitamins 7 days prior to surgery. Surgeon told pt to reach out to PCP to see if it is okay for her to wait until after her surgery to resume the b12 injections?

## 2017-06-01 NOTE — Telephone Encounter (Signed)
Please advise 

## 2017-06-01 NOTE — Telephone Encounter (Signed)
Informed pt she can suspend B12 until after surgery.

## 2017-06-01 NOTE — Telephone Encounter (Signed)
Copied from [place] [id number]. Topic: General - Other >> Jun 01, 2017  9:02 AM Darl Householder, RMA wrote: Reason for CRM: Patient is requesting a call back concerning surgery on Thursday 06/03/17 and B-12 injection, please return pt call at [phone]

## 2017-06-01 NOTE — Telephone Encounter (Signed)
Yes she can suspend b12 until after surgery

## 2017-06-03 ENCOUNTER — Encounter (HOSPITAL_COMMUNITY): Payer: Self-pay | Admitting: Certified Registered"

## 2017-06-03 ENCOUNTER — Observation Stay (HOSPITAL_COMMUNITY)
Admission: RE | Admit: 2017-06-03 | Discharge: 2017-06-04 | Disposition: A | Discharge status: Home / Self Care | Payer: 59 | Source: Ambulatory Visit | Attending: Plastic Surgery | Admitting: Plastic Surgery

## 2017-06-03 ENCOUNTER — Encounter (HOSPITAL_COMMUNITY)
Admission: RE | Disposition: A | Discharge status: Home / Self Care | Payer: Self-pay | Source: Ambulatory Visit | Attending: Plastic Surgery

## 2017-06-03 ENCOUNTER — Ambulatory Visit (HOSPITAL_COMMUNITY): Payer: 59 | Admitting: Certified Registered"

## 2017-06-03 ENCOUNTER — Other Ambulatory Visit: Payer: Self-pay

## 2017-06-03 DIAGNOSIS — R920 Mammographic microcalcification found on diagnostic imaging of breast: Secondary | ICD-10-CM | POA: Diagnosis not present

## 2017-06-03 DIAGNOSIS — N6092 Unspecified benign mammary dysplasia of left breast: Principal | ICD-10-CM | POA: Insufficient documentation

## 2017-06-03 DIAGNOSIS — N649 Disorder of breast, unspecified: Secondary | ICD-10-CM | POA: Diagnosis not present

## 2017-06-03 DIAGNOSIS — Z79899 Other long term (current) drug therapy: Secondary | ICD-10-CM | POA: Diagnosis not present

## 2017-06-03 DIAGNOSIS — C50919 Malignant neoplasm of unspecified site of unspecified female breast: Secondary | ICD-10-CM | POA: Diagnosis present

## 2017-06-03 DIAGNOSIS — E538 Deficiency of other specified B group vitamins: Secondary | ICD-10-CM | POA: Diagnosis not present

## 2017-06-03 DIAGNOSIS — N6489 Other specified disorders of breast: Secondary | ICD-10-CM | POA: Diagnosis not present

## 2017-06-03 DIAGNOSIS — Z791 Long term (current) use of non-steroidal anti-inflammatories (NSAID): Secondary | ICD-10-CM | POA: Insufficient documentation

## 2017-06-03 DIAGNOSIS — K2970 Gastritis, unspecified, without bleeding: Secondary | ICD-10-CM | POA: Diagnosis not present

## 2017-06-03 DIAGNOSIS — C50912 Malignant neoplasm of unspecified site of left female breast: Secondary | ICD-10-CM

## 2017-06-03 DIAGNOSIS — N6012 Diffuse cystic mastopathy of left breast: Secondary | ICD-10-CM | POA: Diagnosis not present

## 2017-06-03 DIAGNOSIS — N6082 Other benign mammary dysplasias of left breast: Secondary | ICD-10-CM | POA: Diagnosis not present

## 2017-06-03 DIAGNOSIS — D497 Neoplasm of unspecified behavior of endocrine glands and other parts of nervous system: Secondary | ICD-10-CM | POA: Diagnosis not present

## 2017-06-03 HISTORY — PX: NIPPLE SPARING MASTECTOMY: SHX6537

## 2017-06-03 HISTORY — PX: BREAST RECONSTRUCTION WITH PLACEMENT OF TISSUE EXPANDER AND FLEX HD (ACELLULAR HYDRATED DERMIS): SHX6295

## 2017-06-03 LAB — POCT PREGNANCY, URINE: Preg Test, Ur: NEGATIVE

## 2017-06-03 SURGERY — MASTECTOMY, NIPPLE SPARING
Anesthesia: General | Site: Breast | Laterality: Left

## 2017-06-03 MED ORDER — LACTATED RINGERS IV SOLN
INTRAVENOUS | Status: DC
  Administered 2017-06-03 (×2): via INTRAVENOUS

## 2017-06-03 MED ORDER — MIDAZOLAM HCL 2 MG/2ML IJ SOLN
INTRAMUSCULAR | Status: AC
  Filled 2017-06-03: qty 2

## 2017-06-03 MED ORDER — ONDANSETRON HCL 4 MG/2ML IJ SOLN: 4.0000 mg | Freq: Four times a day (QID) | INTRAMUSCULAR | Status: DC | PRN

## 2017-06-03 MED ORDER — SODIUM CHLORIDE 0.9 % IV SOLN
INTRAVENOUS | Status: DC | PRN
  Administered 2017-06-03: 500 mL

## 2017-06-03 MED ORDER — ACETAMINOPHEN 500 MG PO TABS
500.0000 mg | ORAL_TABLET | Freq: Four times a day (QID) | ORAL | Status: DC
  Administered 2017-06-03: 500 mg via ORAL

## 2017-06-03 MED ORDER — SUGAMMADEX SODIUM 200 MG/2ML IV SOLN
INTRAVENOUS | Status: DC | PRN
  Administered 2017-06-03: 200 mg via INTRAVENOUS

## 2017-06-03 MED ORDER — ACETAMINOPHEN 500 MG PO TABS
ORAL_TABLET | ORAL | Status: AC
  Administered 2017-06-03: 500 mg
  Filled 2017-06-03: qty 1

## 2017-06-03 MED ORDER — DIAZEPAM 2 MG PO TABS: 2.0000 mg | ORAL_TABLET | Freq: Two times a day (BID) | ORAL | Status: DC | PRN

## 2017-06-03 MED ORDER — DEXAMETHASONE SODIUM PHOSPHATE 10 MG/ML IJ SOLN
INTRAMUSCULAR | Status: DC | PRN
  Administered 2017-06-03: 10 mg via INTRAVENOUS

## 2017-06-03 MED ORDER — ONDANSETRON HCL 4 MG/2ML IJ SOLN
INTRAMUSCULAR | Status: DC | PRN
  Administered 2017-06-03 (×2): 4 mg via INTRAVENOUS

## 2017-06-03 MED ORDER — ONDANSETRON 4 MG PO TBDP: 4.0000 mg | ORAL_TABLET | Freq: Four times a day (QID) | ORAL | Status: DC | PRN

## 2017-06-03 MED ORDER — HYDROMORPHONE HCL 1 MG/ML IJ SOLN: 0.5000 mg | INTRAMUSCULAR | Status: DC | PRN

## 2017-06-03 MED ORDER — MEPERIDINE HCL 50 MG/ML IJ SOLN: 6.2500 mg | INTRAMUSCULAR | Status: DC | PRN

## 2017-06-03 MED ORDER — CEFAZOLIN SODIUM-DEXTROSE 2-4 GM/100ML-% IV SOLN: 2.0000 g | Freq: Three times a day (TID) | INTRAVENOUS | Status: DC

## 2017-06-03 MED ORDER — ACETAMINOPHEN 500 MG PO TABS
500.0000 mg | ORAL_TABLET | Freq: Four times a day (QID) | ORAL | Status: DC
  Administered 2017-06-03 – 2017-06-04 (×3): 500 mg via ORAL
  Filled 2017-06-03 (×3): qty 1

## 2017-06-03 MED ORDER — ACETAMINOPHEN 500 MG PO TABS
1000.0000 mg | ORAL_TABLET | ORAL | Status: AC
  Administered 2017-06-03: 1000 mg via ORAL
  Filled 2017-06-03: qty 2

## 2017-06-03 MED ORDER — PROPOFOL 10 MG/ML IV BOLUS
INTRAVENOUS | Status: DC | PRN
  Administered 2017-06-03: 80 mg via INTRAVENOUS

## 2017-06-03 MED ORDER — MIDAZOLAM HCL 2 MG/2ML IJ SOLN
2.0000 mg | Freq: Once | INTRAMUSCULAR | Status: AC
  Administered 2017-06-03: 2 mg via INTRAVENOUS

## 2017-06-03 MED ORDER — PROPOFOL 10 MG/ML IV BOLUS
INTRAVENOUS | Status: AC
  Filled 2017-06-03: qty 20

## 2017-06-03 MED ORDER — SCOPOLAMINE 1 MG/3DAYS TD PT72
MEDICATED_PATCH | TRANSDERMAL | Status: DC | PRN
  Administered 2017-06-03: 1 via TRANSDERMAL

## 2017-06-03 MED ORDER — HYDROMORPHONE HCL 1 MG/ML IJ SOLN: 0.2500 mg | INTRAMUSCULAR | Status: DC | PRN

## 2017-06-03 MED ORDER — GABAPENTIN 300 MG PO CAPS
300.0000 mg | ORAL_CAPSULE | ORAL | Status: AC
  Administered 2017-06-03: 300 mg via ORAL
  Filled 2017-06-03: qty 1

## 2017-06-03 MED ORDER — FENTANYL CITRATE (PF) 100 MCG/2ML IJ SOLN
INTRAMUSCULAR | Status: AC
  Filled 2017-06-03: qty 2

## 2017-06-03 MED ORDER — POLYETHYLENE GLYCOL 3350 17 G PO PACK: 17.0000 g | PACK | Freq: Every day | ORAL | Status: DC | PRN

## 2017-06-03 MED ORDER — DIPHENHYDRAMINE HCL 50 MG/ML IJ SOLN: 12.5000 mg | Freq: Four times a day (QID) | INTRAMUSCULAR | Status: DC | PRN

## 2017-06-03 MED ORDER — LIDOCAINE HCL (CARDIAC) 20 MG/ML IV SOLN
INTRAVENOUS | Status: AC
  Filled 2017-06-03: qty 5

## 2017-06-03 MED ORDER — FENTANYL CITRATE (PF) 250 MCG/5ML IJ SOLN
INTRAMUSCULAR | Status: AC
  Filled 2017-06-03: qty 5

## 2017-06-03 MED ORDER — PHENYLEPHRINE 40 MCG/ML (10ML) SYRINGE FOR IV PUSH (FOR BLOOD PRESSURE SUPPORT)
PREFILLED_SYRINGE | INTRAVENOUS | Status: AC
  Filled 2017-06-03: qty 10

## 2017-06-03 MED ORDER — HYDROCODONE-ACETAMINOPHEN 5-325 MG PO TABS
1.0000 | ORAL_TABLET | ORAL | Status: DC | PRN
  Filled 2017-06-03: qty 2

## 2017-06-03 MED ORDER — DEXTROSE 5 % IV SOLN
INTRAVENOUS | Status: DC | PRN
  Administered 2017-06-03: 20 ug/min via INTRAVENOUS

## 2017-06-03 MED ORDER — PHENYLEPHRINE HCL 10 MG/ML IJ SOLN
INTRAMUSCULAR | Status: DC | PRN
  Administered 2017-06-03 (×5): 80 ug via INTRAVENOUS

## 2017-06-03 MED ORDER — ONDANSETRON HCL 4 MG/2ML IJ SOLN: 4.0000 mg | Freq: Once | INTRAMUSCULAR | Status: DC | PRN

## 2017-06-03 MED ORDER — SENNA 8.6 MG PO TABS
1.0000 | ORAL_TABLET | Freq: Two times a day (BID) | ORAL | Status: DC
  Filled 2017-06-03 (×2): qty 1

## 2017-06-03 MED ORDER — NAPROXEN 500 MG PO TABS
500.0000 mg | ORAL_TABLET | Freq: Two times a day (BID) | ORAL | Status: DC | PRN
  Filled 2017-06-03: qty 1

## 2017-06-03 MED ORDER — ROCURONIUM BROMIDE 10 MG/ML (PF) SYRINGE
PREFILLED_SYRINGE | INTRAVENOUS | Status: AC
  Filled 2017-06-03: qty 5

## 2017-06-03 MED ORDER — CEFAZOLIN SODIUM-DEXTROSE 2-4 GM/100ML-% IV SOLN
2.0000 g | INTRAVENOUS | Status: AC
  Administered 2017-06-03: 2 g via INTRAVENOUS
  Filled 2017-06-03: qty 100

## 2017-06-03 MED ORDER — FENTANYL CITRATE (PF) 100 MCG/2ML IJ SOLN
INTRAMUSCULAR | Status: DC | PRN
  Administered 2017-06-03 (×2): 50 ug via INTRAVENOUS

## 2017-06-03 MED ORDER — SENNA 8.6 MG PO TABS
1.0000 | ORAL_TABLET | Freq: Two times a day (BID) | ORAL | Status: DC
  Administered 2017-06-03: 8.6 mg via ORAL
  Filled 2017-06-03: qty 1

## 2017-06-03 MED ORDER — NAPROXEN 250 MG PO TABS
500.0000 mg | ORAL_TABLET | Freq: Two times a day (BID) | ORAL | Status: DC | PRN
Start: 2017-06-03 — End: 2017-06-04

## 2017-06-03 MED ORDER — SODIUM CHLORIDE 0.9 % IV SOLN
INTRAVENOUS | Status: AC
  Filled 2017-06-03: qty 500000

## 2017-06-03 MED ORDER — SCOPOLAMINE 1 MG/3DAYS TD PT72
MEDICATED_PATCH | TRANSDERMAL | Status: AC
  Filled 2017-06-03: qty 2

## 2017-06-03 MED ORDER — 0.9 % SODIUM CHLORIDE (POUR BTL) OPTIME
TOPICAL | Status: DC | PRN
  Administered 2017-06-03 (×2): 1000 mL

## 2017-06-03 MED ORDER — LIDOCAINE 2% (20 MG/ML) 5 ML SYRINGE
INTRAMUSCULAR | Status: DC | PRN
  Administered 2017-06-03: 80 mg via INTRAVENOUS

## 2017-06-03 MED ORDER — SUGAMMADEX SODIUM 200 MG/2ML IV SOLN
INTRAVENOUS | Status: AC
  Filled 2017-06-03: qty 2

## 2017-06-03 MED ORDER — HYDROCODONE-ACETAMINOPHEN 5-325 MG PO TABS: 1.0000 | ORAL_TABLET | ORAL | Status: DC | PRN

## 2017-06-03 MED ORDER — DEXAMETHASONE SODIUM PHOSPHATE 10 MG/ML IJ SOLN
INTRAMUSCULAR | Status: AC
  Filled 2017-06-03: qty 2

## 2017-06-03 MED ORDER — CEFAZOLIN SODIUM-DEXTROSE 2-4 GM/100ML-% IV SOLN
2.0000 g | Freq: Three times a day (TID) | INTRAVENOUS | Status: DC
  Administered 2017-06-03 – 2017-06-04 (×2): 2 g via INTRAVENOUS
  Filled 2017-06-03 (×3): qty 100

## 2017-06-03 MED ORDER — ONDANSETRON HCL 4 MG/2ML IJ SOLN
INTRAMUSCULAR | Status: AC
  Filled 2017-06-03: qty 6

## 2017-06-03 MED ORDER — DIPHENHYDRAMINE HCL 12.5 MG/5ML PO ELIX: 12.5000 mg | ORAL_SOLUTION | Freq: Four times a day (QID) | ORAL | Status: DC | PRN

## 2017-06-03 MED ORDER — ROCURONIUM BROMIDE 100 MG/10ML IV SOLN
INTRAVENOUS | Status: DC | PRN
  Administered 2017-06-03 (×4): 10 mg via INTRAVENOUS
  Administered 2017-06-03: 50 mg via INTRAVENOUS

## 2017-06-03 MED ORDER — FENTANYL CITRATE (PF) 100 MCG/2ML IJ SOLN
50.0000 ug | Freq: Once | INTRAMUSCULAR | Status: AC
  Administered 2017-06-03: 50 ug via INTRAVENOUS

## 2017-06-03 SURGICAL SUPPLY — 73 items
ADH SKN CLS APL DERMABOND .7 (GAUZE/BANDAGES/DRESSINGS) ×2
APPLIER CLIP 9.375 MED OPEN (MISCELLANEOUS) ×3
APR CLP MED 9.3 20 MLT OPN (MISCELLANEOUS) ×1
BAG DECANTER FOR FLEXI CONT (MISCELLANEOUS) ×3 IMPLANT
BINDER BREAST LRG (GAUZE/BANDAGES/DRESSINGS) IMPLANT
BINDER BREAST XLRG (GAUZE/BANDAGES/DRESSINGS) IMPLANT
BIOPATCH RED 1 DISK 7.0 (GAUZE/BANDAGES/DRESSINGS) ×4 IMPLANT
BIOPATCH RED 1IN DISK 7.0MM (GAUZE/BANDAGES/DRESSINGS) ×2
CANISTER SUCT 3000ML PPV (MISCELLANEOUS) ×6 IMPLANT
CHLORAPREP W/TINT 26ML (MISCELLANEOUS) ×6 IMPLANT
CLIP APPLIE 9.375 MED OPEN (MISCELLANEOUS) ×1 IMPLANT
CONT SPEC 4OZ CLIKSEAL STRL BL (MISCELLANEOUS) ×3 IMPLANT
COVER PROBE W GEL 5X96 (DRAPES) ×3 IMPLANT
COVER SURGICAL LIGHT HANDLE (MISCELLANEOUS) ×6 IMPLANT
DERMABOND ADVANCED (GAUZE/BANDAGES/DRESSINGS) ×4
DERMABOND ADVANCED .7 DNX12 (GAUZE/BANDAGES/DRESSINGS) ×2 IMPLANT
DEVICE DISSECT PLASMABLAD 3.0S (MISCELLANEOUS) ×1 IMPLANT
DRAIN CHANNEL 19F RND (DRAIN) ×9 IMPLANT
DRAPE HALF SHEET 40X57 (DRAPES) ×3 IMPLANT
DRAPE ORTHO SPLIT 77X108 STRL (DRAPES) ×6
DRAPE SURG 17X23 STRL (DRAPES) ×12 IMPLANT
DRAPE SURG ORHT 6 SPLT 77X108 (DRAPES) ×2 IMPLANT
DRAPE U-SHAPE 76X120 STRL (DRAPES) ×6 IMPLANT
DRAPE WARM FLUID 44X44 (DRAPE) ×3 IMPLANT
ELECT BLADE 4.0 EZ CLEAN MEGAD (MISCELLANEOUS) ×3
ELECT CAUTERY BLADE 6.4 (BLADE) ×3 IMPLANT
ELECT REM PT RETURN 9FT ADLT (ELECTROSURGICAL) ×6
ELECTRODE BLDE 4.0 EZ CLN MEGD (MISCELLANEOUS) ×1 IMPLANT
ELECTRODE REM PT RTRN 9FT ADLT (ELECTROSURGICAL) ×2 IMPLANT
EVACUATOR SILICONE 100CC (DRAIN) ×9 IMPLANT
GAUZE SPONGE 4X4 12PLY STRL (GAUZE/BANDAGES/DRESSINGS) ×3 IMPLANT
GLOVE BIO SURGEON STRL SZ 6.5 (GLOVE) ×4 IMPLANT
GLOVE BIO SURGEON STRL SZ7.5 (GLOVE) ×3 IMPLANT
GLOVE BIO SURGEONS STRL SZ 6.5 (GLOVE) ×2
GOWN STRL REUS W/ TWL LRG LVL3 (GOWN DISPOSABLE) ×4 IMPLANT
GOWN STRL REUS W/TWL LRG LVL3 (GOWN DISPOSABLE) ×12
GRAFT FLEX HD 4X16 THICK (Tissue Mesh) ×3 IMPLANT
IMPL BREAST 300CC (Breast) ×1 IMPLANT
IMPLANT BREAST 300CC (Breast) ×3 IMPLANT
KIT BASIN OR (CUSTOM PROCEDURE TRAY) ×3 IMPLANT
KIT MARKER MARGIN INK (KITS) ×3 IMPLANT
KIT TURNOVER KIT B (KITS) ×3 IMPLANT
LIGHT WAVEGUIDE WIDE FLAT (MISCELLANEOUS) ×3 IMPLANT
NEEDLE 18GX1X1/2 (RX/OR ONLY) (NEEDLE) IMPLANT
NEEDLE FILTER BLUNT 18X 1/2SAF (NEEDLE)
NEEDLE FILTER BLUNT 18X1 1/2 (NEEDLE) IMPLANT
NEEDLE HYPO 25GX1X1/2 BEV (NEEDLE) IMPLANT
NS IRRIG 1000ML POUR BTL (IV SOLUTION) ×9 IMPLANT
PACK GENERAL/GYN (CUSTOM PROCEDURE TRAY) ×6 IMPLANT
PAD ABD 8X10 STRL (GAUZE/BANDAGES/DRESSINGS) ×6 IMPLANT
PAD ARMBOARD 7.5X6 YLW CONV (MISCELLANEOUS) ×6 IMPLANT
PIN SAFETY STERILE (MISCELLANEOUS) ×3 IMPLANT
PLASMABLADE 3.0S (MISCELLANEOUS) ×3
SET ASEPTIC TRANSFER (MISCELLANEOUS) IMPLANT
SPECIMEN JAR X LARGE (MISCELLANEOUS) ×3 IMPLANT
SPONGE GAUZE 4X4 12PLY STER LF (GAUZE/BANDAGES/DRESSINGS) ×3 IMPLANT
SUT ETHILON 2 0 FS 18 (SUTURE) ×9 IMPLANT
SUT MNCRL AB 4-0 PS2 18 (SUTURE) ×6 IMPLANT
SUT MON AB 3-0 SH 27 (SUTURE) ×3
SUT MON AB 3-0 SH27 (SUTURE) ×1 IMPLANT
SUT MON AB 5-0 PS2 18 (SUTURE) ×3 IMPLANT
SUT PDS AB 2-0 CT1 27 (SUTURE) ×9 IMPLANT
SUT SILK 2 0 SH (SUTURE) IMPLANT
SUT SILK 4 0 PS 2 (SUTURE) ×3 IMPLANT
SUT VIC AB 3-0 54X BRD REEL (SUTURE) ×1 IMPLANT
SUT VIC AB 3-0 BRD 54 (SUTURE) ×3
SUT VIC AB 3-0 SH 18 (SUTURE) ×3 IMPLANT
SYR CONTROL 10ML LL (SYRINGE) IMPLANT
TOWEL OR 17X24 6PK STRL BLUE (TOWEL DISPOSABLE) ×6 IMPLANT
TOWEL OR 17X26 10 PK STRL BLUE (TOWEL DISPOSABLE) ×6 IMPLANT
TRAY FOLEY W/METER SILVER 14FR (SET/KITS/TRAYS/PACK) IMPLANT
TUBE CONNECTING 12'X1/4 (SUCTIONS)
TUBE CONNECTING 12X1/4 (SUCTIONS) IMPLANT

## 2017-06-03 NOTE — Anesthesia Preprocedure Evaluation (Signed)
Anesthesia Evaluation  Patient identified by MRN, date of birth, ID band Patient awake    Reviewed: Allergy & Precautions, NPO status , Patient's Chart, lab work & pertinent test results  Airway Mallampati: I  TM Distance: >3 FB Neck ROM: Full    Dental   Pulmonary    Pulmonary exam normal        Cardiovascular Normal cardiovascular exam     Neuro/Psych    GI/Hepatic   Endo/Other    Renal/GU      Musculoskeletal   Abdominal   Peds  Hematology   Anesthesia Other Findings   Reproductive/Obstetrics                             Anesthesia Physical Anesthesia Plan  ASA: II  Anesthesia Plan: General   Post-op Pain Management:    Induction: Intravenous  PONV Risk Score and Plan: 3 and Ondansetron, Dexamethasone and Midazolam  Airway Management Planned: Oral ETT  Additional Equipment:   Intra-op Plan:   Post-operative Plan: Extubation in OR  Informed Consent: I have reviewed the patients History and Physical, chart, labs and discussed the procedure including the risks, benefits and alternatives for the proposed anesthesia with the patient or authorized representative who has indicated his/her understanding and acceptance.       Plan Discussed with: CRNA and Surgeon  Anesthesia Plan Comments:         Anesthesia Quick Evaluation  

## 2017-06-03 NOTE — Anesthesia Procedure Notes (Signed)
Procedure Name: Intubation Date/Time: 06/03/2017 11:16 AM Performed by: Gwyndolyn Saxon, CRNA Pre-anesthesia Checklist: Patient identified, Emergency Drugs available, Suction available, Patient being monitored and Timeout performed Patient Re-evaluated:Patient Re-evaluated prior to induction Oxygen Delivery Method: Circle system utilized Preoxygenation: Pre-oxygenation with 100% oxygen Induction Type: IV induction Ventilation: Mask ventilation without difficulty Laryngoscope Size: Mac and 3 Grade View: Grade I Tube type: Oral Tube size: 7.0 mm Number of attempts: 1 Placement Confirmation: ETT inserted through vocal cords under direct vision,  positive ETCO2,  CO2 detector and breath sounds checked- equal and bilateral Secured at: 21 cm Tube secured with: Tape Dental Injury: Teeth and Oropharynx as per pre-operative assessment

## 2017-06-03 NOTE — Op Note (Signed)
First Assist Op Note: [facility] Main OR I assisted the Surgeon(s) ___Dr. Lyndee Leo Dillingham_____ on the procedure(s): _____Immediate Left breast reconstruction with tissue expander and dermal matrix (Flex HD)____on Date __4/4/19_______  I provided my assistance on this case as follows:  I was present and acted as first Environmental consultant during this operation. A time out was performed and all information confirmed to be correct.  I first assisted during the case including retraction for exposure, assisting with closure of surgical wounds and application of sterile dressings. I provided assistance with application of post operative garments/splinting and assisted with patient transfer back to the stretcher as needed.   Nickolai Rinks,PA-C Plastic Surgery [phone]

## 2017-06-03 NOTE — Transfer of Care (Signed)
Immediate Anesthesia Transfer of Care Note  Patient: MODEAN MCCULLUM  Procedure(s) Performed: LEFT BREAST NIPPLE SPARING MASTECTOMY (Left Breast) LEFT BREAST RECONSTRUCTION WITH PLACEMENT OF TISSUE EXPANDER AND FLEX HD (ACELLULAR HYDRATED DERMIS) (Left Breast)  Patient Location: PACU  Anesthesia Type:General  Level of Consciousness: awake, alert  and oriented  Airway & Oxygen Therapy: Patient Spontanous Breathing  Post-op Assessment: Report given to RN and Post -op Vital signs reviewed and stable  Post vital signs: Reviewed and stable  Last Vitals:  Vitals Value Taken Time  BP    Temp    Pulse 84 06/03/2017  2:07 PM  Resp 16 06/03/2017  2:07 PM  SpO2 100 % 06/03/2017  2:07 PM  Vitals shown include unvalidated device data.  Last Pain:  Vitals:   06/03/17 0957  TempSrc:   PainSc: 0-No pain         Complications: No apparent anesthesia complications

## 2017-06-03 NOTE — Op Note (Signed)
Op report    DATE OF OPERATION:  06/03/2017  LOCATION: [facility] Operating Room  SURGICAL DIVISION: Plastic Surgery  PREOPERATIVE DIAGNOSES:  1. Left Breast disease.    POSTOPERATIVE DIAGNOSES:  1. Left Breast disease.   PROCEDURE:  1. Left immediate breast reconstruction with placement of Acellular Dermal Matrix and tissue expanders.  SURGEON: Bayden Gil Sanger Julianah Marciel, DO  ASSISTANT: Shawn Rayburn, PA  ANESTHESIA:  General.   COMPLICATIONS: None.   IMPLANTS: Left - Mentor 300 cc, 100 cc of injectable saline placed Acellular Dermal Matrix 4 x 16 cm  INDICATIONS FOR PROCEDURE:  The patient, Robin Riley, is a 42 y.o. female born on [birth year], is here for  immediate first stage breast reconstruction with placement of left tissue expander and Acellular dermal matrix. MRN: [id number]  CONSENT:  Informed consent was obtained directly from the patient. Risks, benefits and alternatives were fully discussed. Specific risks including but not limited to bleeding, infection, hematoma, seroma, scarring, pain, implant infection, implant extrusion, capsular contracture, asymmetry, wound healing problems, and need for further surgery were all discussed. The patient did have an ample opportunity to have her questions answered to her satisfaction.   DESCRIPTION OF PROCEDURE:  The patient was taken to the operating room by the general surgery team. SCDs were placed and IV antibiotics were given. The patient's chest was prepped and draped in a sterile fashion. A time out was performed and the implants to be used were identified.  A left mastectomy was performed.  Once the general surgery team had completed their portion of the case the patient was rendered to the plastic and reconstructive surgery team.  The pectoralis major muscle was lifted from the chest wall with release of the lateral edge and lateral inframammary fold.  The pocket was irrigated with antibiotic solution and hemostasis  was achieved with electrocautery.  The ADM was then prepared according to the manufacture guidelines and slits placed to help with postoperative fluid management.  The ADM was then sutured to the inferior and lateral edge of the inframammary fold with 2-0 PDS starting with an interrupted stitch and then a running stitch.  The lateral portion was sutured to with interrupted sutures after the expander was placed.  The expander was prepared according to the manufacture guidelines, the air evacuated and then it was placed under the ADM and pectoralis major muscle.  The inferior and lateral tabs were used to secure the expander to the chest wall with 2-0 PDS.  The drain was placed at the inframammary fold over the ADM and secured to the skin with 3-0 Silk.    The deep layers were closed with 3-0 Monocryl followed by 4-0 Monocryl.  The skin was closed with 5-0 Monocryl and then dermabond was applied.  The ABDs and breast binder were placed.  The patient tolerated the procedure well and there were no complications.  The patient was allowed to wake from anesthesia and taken to the recovery room in satisfactory condition.   The advanced practice practitioner assisted throughout the case.  The APP was essential in retraction and counter traction when needed to make the case progress smoothly.  The assistance was needed for blood control, tissue re-approximation and assisted with closure of the incision site.

## 2017-06-03 NOTE — H&P (Signed)
Robin Riley  Location: [facility] Surgery Patient #: [id number] DOB: [birth year] Married / Language: English / Race: White Female   History of Present Illness  The patient is a 42 year old female who presents for a follow-up for Breast mass. The patient is a 42 year old white female who presents with a recent screening mammogram that showed an increased area of microcalcification along the lower portion of the left breast covering about 5 cm. She is about 2-1/2 years status post left breast lumpectomy in the lower outer quadrant for atypical duct hyperplasia. She now has a new area involving the lower inner left breast that was biopsied and is also atypical duct hyperplasia. The other biopsies along the lower portion of the breast showed fibrocystic changes with calcification. She was referred to the high-risk clinic to have years ago but decided not to take any chemoprophylaxis.   Problem List/Past Medical  ATYPICAL DUCTAL HYPERPLASIA OF LEFT BREAST (N60.92)   Past Surgical History  Breast Biopsy  Left. multiple Oral Surgery  Tonsillectomy   Diagnostic Studies History  Colonoscopy  never Mammogram  within last year Pap Smear  1-5 years ago  Allergies  Sulfa Antibiotics   Medication History  Calcium 600 (1500MG  Tablet, Oral) Active. Advil PM (200-25MG  Capsule, Oral) Active. Multivitamins (Oral) Active. Medications Reconciled  Social History  Caffeine use  Coffee. No alcohol use  No drug use  Tobacco use  Never smoker.  Family History  Hypertension  Mother. Ovarian Cancer  Family Members In General. Prostate Cancer  Family Members In General.  Pregnancy / Birth History  Age at menarche  16 years. Gravida  2 Irregular periods  Maternal age  14-30 Para  2  Other Problems  No pertinent past medical history     Review of Systems  General Not Present- Appetite Loss, Chills, Fatigue, Fever, Night Sweats, Weight Gain and Weight  Loss. Skin Not Present- Change in Wart/Mole, Dryness, Hives, Jaundice, New Lesions, Non-Healing Wounds, Rash and Ulcer. HEENT Present- Wears glasses/contact lenses. Not Present- Earache, Hearing Loss, Hoarseness, Nose Bleed, Oral Ulcers, Ringing in the Ears, Seasonal Allergies, Sinus Pain, Sore Throat, Visual Disturbances and Yellow Eyes. Respiratory Not Present- Bloody sputum, Chronic Cough, Difficulty Breathing, Snoring and Wheezing. Breast Not Present- Breast Mass, Breast Pain, Nipple Discharge and Skin Changes. Cardiovascular Not Present- Chest Pain, Difficulty Breathing Lying Down, Leg Cramps, Palpitations, Rapid Heart Rate, Shortness of Breath and Swelling of Extremities. Gastrointestinal Not Present- Abdominal Pain, Bloating, Bloody Stool, Change in Bowel Habits, Chronic diarrhea, Constipation, Difficulty Swallowing, Excessive gas, Gets full quickly at meals, Hemorrhoids, Indigestion, Nausea, Rectal Pain and Vomiting. Female Genitourinary Not Present- Frequency, Nocturia, Painful Urination, Pelvic Pain and Urgency. Musculoskeletal Not Present- Back Pain, Joint Pain, Joint Stiffness, Muscle Pain, Muscle Weakness and Swelling of Extremities. Neurological Not Present- Decreased Memory, Fainting, Headaches, Numbness, Seizures, Tingling, Tremor, Trouble walking and Weakness. Psychiatric Not Present- Anxiety, Bipolar, Change in Sleep Pattern, Depression, Fearful and Frequent crying. Endocrine Not Present- Cold Intolerance, Excessive Hunger, Hair Changes, Heat Intolerance, Hot flashes and New Diabetes. Hematology Not Present- Easy Bruising, Excessive bleeding, Gland problems, HIV and Persistent Infections.  Vitals  Weight: 126.38 lb BP: 110/72 (Sitting, Left Arm, Standard)       Physical Exam  General Mental Status-Alert. General Appearance-Consistent with stated age. Hydration-Well hydrated. Voice-Normal.  Head and Neck Head-normocephalic, atraumatic with no lesions or  palpable masses. Trachea-midline. Thyroid Gland Characteristics - normal size and consistency.  Eye Eyeball - Bilateral-Extraocular movements intact. Sclera/Conjunctiva - Bilateral-No scleral  icterus.  Chest and Lung Exam Chest and lung exam reveals -quiet, even and easy respiratory effort with no use of accessory muscles and on auscultation, normal breath sounds, no adventitious sounds and normal vocal resonance. Inspection Chest Wall - Normal. Back - normal.  Breast Note: The left breast incision in the outer aspect of the peri-areolar area has healed nicely with no sign of infection or seroma. There is no palpable mass in either breast. There is no palpable axillary, supraclavicular, or cervical lymphadenopathy. There is dense breast tissue bilaterally that is symmetric   Cardiovascular Cardiovascular examination reveals -normal heart sounds, regular rate and rhythm with no murmurs and normal pedal pulses bilaterally.  Abdomen Inspection Inspection of the abdomen reveals - No Hernias. Skin - Scar - no surgical scars. Palpation/Percussion Palpation and Percussion of the abdomen reveal - Soft, Non Tender, No Rebound tenderness, No Rigidity (guarding) and No hepatosplenomegaly. Auscultation Auscultation of the abdomen reveals - Bowel sounds normal.  Neurologic Neurologic evaluation reveals -alert and oriented x 3 with no impairment of recent or remote memory. Mental Status-Normal.  Musculoskeletal Normal Exam - Left-Upper Extremity Strength Normal and Lower Extremity Strength Normal. Normal Exam - Right-Upper Extremity Strength Normal and Lower Extremity Strength Normal.  Lymphatic Head & Neck  General Head & Neck Lymphatics: Bilateral - Description - Normal. Axillary  General Axillary Region: Bilateral - Description - Normal. Tenderness - Non Tender. Femoral & Inguinal  Generalized Femoral & Inguinal Lymphatics: Bilateral - Description - Normal.  Tenderness - Non Tender.     ATYPICAL DUCTAL HYPERPLASIA OF LEFT BREAST (N60.92) Impression: The patient is 2-1/2 years status post left breast lumpectomy for atypical ductal hyperplasia in the lower outer left breast. She now has a new area of atypical ductal hyperplasia in the lower inner quadrant of the left breast. She also has calcifications covering most of the lower portion of the left breast. I have talked to her in detail about the different options for managing this which include lumpectomy versus prophylactic mastectomy as well as chemoprophylaxis with tamoxifen. She is undecided. At this point I will go ahead and make her referral to the high risk clinic to talk about risk reduction. I will refer her to plastic surgery to talk about reconstructive options. She will call me when she has thought about it and made her decision and we will proceed accordingly. Current Plans Referred to Oncology, for evaluation and follow up (Oncology). Routine. Referred to Surgery - Plastic, for evaluation and follow up (Plastic Surgery). Routine.

## 2017-06-03 NOTE — Anesthesia Postprocedure Evaluation (Signed)
Anesthesia Post Note  Patient: Robin Riley  Procedure(s) Performed: LEFT BREAST NIPPLE SPARING MASTECTOMY (Left Breast) LEFT BREAST RECONSTRUCTION WITH PLACEMENT OF TISSUE EXPANDER AND FLEX HD (ACELLULAR HYDRATED DERMIS) (Left Breast)     Patient location during evaluation: PACU Anesthesia Type: General Level of consciousness: awake and alert Pain management: pain level controlled Vital Signs Assessment: post-procedure vital signs reviewed and stable Respiratory status: spontaneous breathing, nonlabored ventilation and respiratory function stable Cardiovascular status: blood pressure returned to baseline and stable Postop Assessment: no apparent nausea or vomiting Anesthetic complications: no    Last Vitals:  Vitals:   06/03/17 1645 06/03/17 1825  BP: 115/73 (!) 122/94  Pulse: 73 71  Resp: 20 18  Temp: 36.7 C 36.7 C  SpO2: 100% 99%    Last Pain:  Vitals:   06/03/17 1825  TempSrc: Oral  PainSc:                  [name]

## 2017-06-03 NOTE — Op Note (Signed)
06/03/2017  12:48 PM  PATIENT:  Robin Riley  42 y.o. female  PRE-OPERATIVE DIAGNOSIS:  LEFT BREAST ADH  POST-OPERATIVE DIAGNOSIS:  LEFT BREAST ADH  PROCEDURE:  Procedure(s): LEFT BREAST NIPPLE SPARING MASTECTOMY (Left)  SURGEON:  Surgeon(s) and Role: Panel 1:    * Jovita Kussmaul, MD - Primary  PHYSICIAN ASSISTANT:   ASSISTANTS: Judyann Munson, RNFA  ANESTHESIA:   general  EBL:  20 mL   BLOOD ADMINISTERED:none  DRAINS: none   LOCAL MEDICATIONS USED:  NONE  SPECIMEN:  Source of Specimen:  left breast  DISPOSITION OF SPECIMEN:  PATHOLOGY  COUNTS:  YES  TOURNIQUET:  * No tourniquets in log *  DICTATION: .Dragon Dictation   After informed consent was obtained the patient was brought to the operating room and placed in the supine position on the operating table.  After adequate induction of general anesthesia the patient's bilateral chest, breast, and axillary areas were prepped with ChloraPrep, allowed to dry, and draped in usual sterile manner.  An appropriate timeout was performed.  A left inframammary fold incision was then made on the central and lateral portion of the breast with a 15 blade knife measuring about 8 cm.  The incision was carried through the skin and subcutaneous tissue sharply with the plasma blade until the dissection reached the chest wall.  Dissection was then carried superiorly along the chest wall separating the breast from the pectoralis muscle with the pectoralis fascia.  Next the breast anteriorly was separated from the rest of the subcutaneous tissue and skin by dissection with the plasma blade and a blunt Kelly clamp.  Once the anterior and posterior dissection reached the superior edge of the breast tissue then the breast was able to be removed from the patient.  The retroareolar tissue was biopsied with a Metzenbaum scissors and sent for frozen section which was benign appearing.  The area of the nipple was marked with a stitch.  Once the  breast was removed it was oriented with the appropriate paint colors.  The wound was examined and found to be hemostatic.  The wound was irrigated with copious amounts of saline and packed with a moistened lap sponge.  The skin flaps appeared healthy and viable.  All needle sponge and instrument counts were correct.  The patient tolerated this portion of the procedure well.  At this point the operation was turned over to Dr. Marla Roe for the reconstruction.  Her portion of the case will be dictated separately.  PLAN OF CARE: Admit for overnight observation  PATIENT DISPOSITION:  PACU - hemodynamically stable.   Delay start of Pharmacological VTE agent (>24hrs) due to surgical blood loss or risk of bleeding: no

## 2017-06-03 NOTE — Interval H&P Note (Signed)
History and Physical Interval Note:  06/03/2017 10:17 AM  Robin Riley  has presented today for surgery, with the diagnosis of LEFT BREAST ADH  The various methods of treatment have been discussed with the patient and family. After consideration of risks, benefits and other options for treatment, the patient has consented to  Procedure(s): LEFT BREAST NIPPLE SPARING MASTECTOMY (Left) BREAST RECONSTRUCTION WITH PLACEMENT OF TISSUE EXPANDER AND FLEX HD (ACELLULAR HYDRATED DERMIS) (Left) as a surgical intervention .  The patient's history has been reviewed, patient examined, no change in status, stable for surgery.  I have reviewed the patient's chart and labs.  Questions were answered to the patient's satisfaction.     TOTH III,Deontae Robson S

## 2017-06-03 NOTE — H&P (Signed)
Robin Riley is an 42 y.o. female.   Chief Complaint: LEFT breast cancer HPI:The patient is a 41 y.o. yrs old wf here for left breast reconstruction.  She underwent a partial mastectomy in in 2017 for a concerning area on the mammogram.  This was found to be atypical ductal hyperplasia.  She did not have radiation.  She went for a routine mammogram again this year and abnormalities were found.  The path has showed ADH again but now with a 40% increase risk of cancer.  After long discussions and consideration with oncology and general surgery the patient has decided to follow the current advice for a left mastectomy.  She is 5 feet 3 inches tall, weight is 126 pounds.  Preop bra = 34A.  She would like to plan for immediate breast reconstruction with expander placement.  She is thin and a good candidate.  Past Medical History:  Diagnosis Date  . Acne    managed by Nehemiah Massed  . Adrenal tumor 2008   found during workup for hypertension  . Cancer ([place])   . Chest pain 2008   negative myoview, attributed to costochondritis  . Gastritis Sept 2008   mild, EGD [facility]   . L4 vertebral fracture ([facility]CC)    remote    Past Surgical History:  Procedure Laterality Date  . BREAST LUMPECTOMY WITH RADIOACTIVE SEED LOCALIZATION Left 10/04/2014   Procedure: LEFT BREAST LUMPECTOMY WITH RADIOACTIVE SEED LOCALIZATION;  Surgeon: Autumn Messing III, MD;  Location: [place];  Service: General;  Laterality: Left;  . ENDOMETRIAL ABLATION  2012  . TONSILLECTOMY      Family History  Problem Relation Age of Onset  . Osteoporosis Mother   . Hypertension Mother   . Heart disease Maternal Grandfather   . Osteoporosis Maternal Grandmother   . Diabetes Maternal Grandmother   . Cancer Maternal Aunt        ovarian   Social History:  reports that she has never smoked. She has never used smokeless tobacco. She reports that she does not drink alcohol or use drugs.  Allergies:  Allergies  Allergen  Reactions  . Sulfa Antibiotics Rash    Medications Prior to Admission  Medication Sig Dispense Refill  . acetaminophen (TYLENOL) 500 MG tablet Take 500 mg by mouth every 6 (six) hours as needed for headache.    . Artificial Tear Solution (SYSTANE CONTACTS OP) Place 2 drops into the left eye daily as needed (dry eye).    . calcium citrate-vitamin D 200-200 MG-UNIT TABS Take 1 tablet by mouth daily.    Marland Kitchen ibuprofen (ADVIL,MOTRIN) 200 MG tablet Take 200 mg by mouth as needed for moderate pain.     . cyanocobalamin (,VITAMIN B-12,) 1000 MCG/ML injection Inject 1 mL (1,000 mcg total) into the muscle every 30 (thirty) days. 12 mL 1    Results for orders placed or performed during the hospital encounter of 06/03/17 (from the past 48 hour(s))  Pregnancy, urine POC     Status: None   Collection Time: 06/03/17  9:26 AM  Result Value Ref Range   Preg Test, Ur NEGATIVE NEGATIVE    Comment:        THE SENSITIVITY OF THIS METHODOLOGY IS >24 mIU/mL    No results found.  Review of Systems  Constitutional: Negative.   HENT: Negative.   Eyes: Negative.   Respiratory: Negative.   Cardiovascular: Negative.   Gastrointestinal: Negative.   Genitourinary: Negative.   Musculoskeletal: Negative.  Skin: Negative.   Neurological: Negative.   Psychiatric/Behavioral: Negative.     Blood pressure (!) 103/56, pulse 95, temperature 97.6 F (36.4 C), temperature source Oral, resp. rate (!) 28, weight 58.5 kg (129 lb), SpO2 100 %. Physical Exam  Constitutional: She is oriented to person, place, and time. She appears well-developed and well-nourished.  HENT:  Head: Normocephalic and atraumatic.  Eyes: Pupils are equal, round, and reactive to light. EOM are normal.  Cardiovascular: Normal rate.  Respiratory: Effort normal.  GI: Soft.  Neurological: She is alert and oriented to person, place, and time.  Skin: Skin is warm.  Psychiatric: She has a normal mood and affect. Her behavior is normal. Judgment  and thought content normal.     Assessment/Plan Plan left breast reconstruction with tissue expander and Flex HD.  The risks that can be encountered with and after placement of a breast expander placement were discussed and include the following but not limited to these: bleeding, infection, delayed healing, anesthesia risks, skin sensation changes, injury to structures including nerves, blood vessels, and muscles which may be temporary or permanent, allergies to tape, suture materials and glues, blood products, topical preparations or injected agents, skin contour irregularities, skin discoloration and swelling, deep vein thrombosis, cardiac and pulmonary complications, pain, which may persist, fluid accumulation, wrinkling of the skin over the expander, changes in nipple or breast sensation, expander leakage or rupture, faulty position of the expander, persistent pain, formation of tight scar tissue around the expander (capsular contracture), possible need for revisional surgery or staged procedures.

## 2017-06-03 NOTE — Progress Notes (Signed)
Patient arrived to 6n20 A&Ox4, VSS, IV intact.  Noted to have incision in left breast with skin glue closure and Jp drain present with bloody drainage.  Abd pad and breast binder in place.  Parents at bedside.  Patient and family oriented to room and equipment.  Will continue to monitor.

## 2017-06-04 ENCOUNTER — Encounter (HOSPITAL_COMMUNITY): Payer: Self-pay | Admitting: General Surgery

## 2017-06-04 DIAGNOSIS — Z791 Long term (current) use of non-steroidal anti-inflammatories (NSAID): Secondary | ICD-10-CM | POA: Diagnosis not present

## 2017-06-04 DIAGNOSIS — Z79899 Other long term (current) drug therapy: Secondary | ICD-10-CM | POA: Diagnosis not present

## 2017-06-04 DIAGNOSIS — N6489 Other specified disorders of breast: Secondary | ICD-10-CM | POA: Diagnosis not present

## 2017-06-04 DIAGNOSIS — N6092 Unspecified benign mammary dysplasia of left breast: Secondary | ICD-10-CM | POA: Diagnosis not present

## 2017-06-04 NOTE — Progress Notes (Signed)
1 Day Post-Op   Subjective/Chief Complaint: Did well overnight. Taking only Tylenol for pain and reports good control. Left breast reconstruction incisions clean, dry and intact. NAC is viable. JP drainage is moderate and serosanguinous.    Objective: Vital signs in last 24 hours: Temp:  [97.5 F (36.4 C)-98.7 F (37.1 C)] 98.2 F (36.8 C) (04/05 0532) Pulse Rate:  [60-100] 65 (04/05 0532) Resp:  [12-28] 16 (04/05 0532) BP: (100-122)/(56-94) 105/66 (04/05 0532) SpO2:  [97 %-100 %] 98 % (04/05 0532) Weight:  [58.5 kg (129 lb)-60.1 kg (132 lb 7.9 oz)] 60.1 kg (132 lb 7.9 oz) (04/04 1645)    Intake/Output from previous day: 04/04 0701 - 04/05 0700 In: 2291.7 [P.O.:120; I.V.:1971.7; IV Piggyback:200] Out: 2890 [Urine:2450; Drains:420; Blood:20] Intake/Output this shift: No intake/output data recorded.  General appearance: alert, cooperative, appears stated age and no distress Left breast reconstruction with TE/ADM, IM incision is clean, dry and intact. Breast flaps/NAC is viable  Lab Results:  No results for input(s): WBC, HGB, HCT, PLT in the last 72 hours. BMET No results for input(s): NA, K, CL, CO2, GLUCOSE, BUN, CREATININE, CALCIUM in the last 72 hours. PT/INR No results for input(s): LABPROT, INR in the last 72 hours. ABG No results for input(s): PHART, HCO3 in the last 72 hours.  Invalid input(s): PCO2, PO2  Studies/Results: No results found.  Anti-infectives: Anti-infectives (From admission, onward)   Start     Dose/Rate Route Frequency Ordered Stop   06/03/17 1930  ceFAZolin (ANCEF) IVPB 2g/100 mL premix  Status:  Discontinued     2 g 200 mL/hr over 30 Minutes Intravenous Every 8 hours 06/03/17 1350 06/03/17 1611   06/03/17 1930  ceFAZolin (ANCEF) IVPB 2g/100 mL premix     2 g 200 mL/hr over 30 Minutes Intravenous Every 8 hours 06/03/17 1649     06/03/17 1228  polymyxin B 500,000 Units, bacitracin 50,000 Units in sodium chloride 0.9 % 500 mL irrigation   Status:  Discontinued       As needed 06/03/17 1228 06/03/17 1345   06/03/17 0921  ceFAZolin (ANCEF) IVPB 2g/100 mL premix     2 g 200 mL/hr over 30 Minutes Intravenous On call to O.R. 06/03/17 0921 06/03/17 1146      Assessment/Plan: s/p Procedure(s): LEFT BREAST NIPPLE SPARING MASTECTOMY (Left) LEFT BREAST RECONSTRUCTION WITH PLACEMENT OF TISSUE EXPANDER AND FLEX HD (ACELLULAR HYDRATED DERMIS) (Left) Discharge home with family.  Continue JP drains Follow up in office next week.   LOS: 0 days   Robin Dhondt,PA-C Plastic Surgery [phone]

## 2017-06-07 ENCOUNTER — Other Ambulatory Visit: Payer: Self-pay | Admitting: Internal Medicine

## 2017-06-07 DIAGNOSIS — E538 Deficiency of other specified B group vitamins: Secondary | ICD-10-CM

## 2017-06-11 NOTE — Discharge Summary (Signed)
Physician Discharge Summary  Patient ID: Robin Riley MRN: [id number] DOB/AGE: [birth year] 42 y.o.  Admit date: 06/03/2017 Discharge date: 06/11/2017  Admission Diagnoses:Atypical ductal hyperplasia of the left breast  Discharge Diagnoses:  Active Problems:   Breast cancer [facility])   Discharged Condition: good  Hospital Course: The patient is a 42 year old white female who presents with a recent screening mammogram that showed an increased area of microcalcification along the lower portion of the left breast covering about 5 cm. She is about 2-1/2 years status post left breast lumpectomy in the lower outer quadrant for atypical duct hyperplasia. She now has a new area involving the lower inner left breast that was biopsied and is also atypical duct hyperplasia. The other biopsies along the lower portion of the breast showed fibrocystic changes with calcification. She was taken to the OR for left breast nipple sparing mastectomy with immediate reconstruction with tissue expander and dermal matrix. She did well following surgery and was discharged home the following day.  Consults: None  Significant Diagnostic Studies: Final path pending  Treatments: surgery: Left Breast Nipple Sparing Mastectomy with immediate left breast reconstruction with tissue expander and dermal matrix.   Discharge Exam: Blood pressure 105/66, pulse 65, temperature 98.2 F (36.8 C), temperature source Oral, resp. rate 16, height 5\' 3"  (1.6 m), weight 60.1 kg (132 lb 7.9 oz), SpO2 98 %. General appearance: alert, cooperative and no distress Resp: clear to auscultation bilaterally Cardio: regular rate and rhythm Left breast flap/NAC viable and without signs of hematoma or infection. JP drainage is serosanguinous  Disposition:   Discharge Instructions    Call MD for:  difficulty breathing, headache or visual disturbances   Complete by:  As directed    Call MD for:  persistant nausea and vomiting   Complete by:   As directed    Call MD for:  redness, tenderness, or signs of infection (pain, swelling, redness, odor or green/yellow discharge around incision site)   Complete by:  As directed    Call MD for:  severe uncontrolled pain   Complete by:  As directed    Call MD for:  temperature >100.4   Complete by:  As directed    Diet - low sodium heart healthy   Complete by:  As directed    Discharge instructions   Complete by:  As directed    Empty drain several times daily and record volumes and bring record to office.  Breast binder or sports bra except when bathing.  Sink bath for upper body   Increase activity slowly   Complete by:  As directed      Allergies as of 06/04/2017      Reactions   Sulfa Antibiotics Rash      Medication List    TAKE these medications   acetaminophen 500 MG tablet Commonly known as:  TYLENOL Take 500 mg by mouth every 6 (six) hours as needed for headache.   calcium citrate-vitamin D 200-200 MG-UNIT Tabs Take 1 tablet by mouth daily.   cyanocobalamin 1000 MCG/ML injection Commonly known as:  (VITAMIN B-12) Inject 1 mL (1,000 mcg total) into the muscle every 30 (thirty) days.   ibuprofen 200 MG tablet Commonly known as:  ADVIL,MOTRIN Take 200 mg by mouth as needed for moderate pain.   SYSTANE CONTACTS OP Place 2 drops into the left eye daily as needed (dry eye).      Follow-up Information    Dillingham, Loel Lofty, DO In 1 week.   Specialty:  Plastic Surgery Contact information: [place] [address] [phone]           Signed: [facility] Plastic [facility] [phone]

## 2017-06-17 LAB — HM PAP SMEAR: HM Pap smear: NORMAL

## 2017-07-06 DIAGNOSIS — M546 Pain in thoracic spine: Secondary | ICD-10-CM | POA: Diagnosis not present

## 2017-07-06 DIAGNOSIS — M4316 Spondylolisthesis, lumbar region: Secondary | ICD-10-CM | POA: Diagnosis not present

## 2017-07-06 DIAGNOSIS — M9902 Segmental and somatic dysfunction of thoracic region: Secondary | ICD-10-CM | POA: Diagnosis not present

## 2017-07-06 DIAGNOSIS — M9903 Segmental and somatic dysfunction of lumbar region: Secondary | ICD-10-CM | POA: Diagnosis not present

## 2017-07-07 DIAGNOSIS — M9903 Segmental and somatic dysfunction of lumbar region: Secondary | ICD-10-CM | POA: Diagnosis not present

## 2017-07-07 DIAGNOSIS — M4316 Spondylolisthesis, lumbar region: Secondary | ICD-10-CM | POA: Diagnosis not present

## 2017-07-07 DIAGNOSIS — M546 Pain in thoracic spine: Secondary | ICD-10-CM | POA: Diagnosis not present

## 2017-07-07 DIAGNOSIS — M9902 Segmental and somatic dysfunction of thoracic region: Secondary | ICD-10-CM | POA: Diagnosis not present

## 2017-07-08 DIAGNOSIS — M9902 Segmental and somatic dysfunction of thoracic region: Secondary | ICD-10-CM | POA: Diagnosis not present

## 2017-07-08 DIAGNOSIS — M9903 Segmental and somatic dysfunction of lumbar region: Secondary | ICD-10-CM | POA: Diagnosis not present

## 2017-07-08 DIAGNOSIS — M546 Pain in thoracic spine: Secondary | ICD-10-CM | POA: Diagnosis not present

## 2017-07-08 DIAGNOSIS — M4316 Spondylolisthesis, lumbar region: Secondary | ICD-10-CM | POA: Diagnosis not present

## 2017-07-13 DIAGNOSIS — M9903 Segmental and somatic dysfunction of lumbar region: Secondary | ICD-10-CM | POA: Diagnosis not present

## 2017-07-13 DIAGNOSIS — M4316 Spondylolisthesis, lumbar region: Secondary | ICD-10-CM | POA: Diagnosis not present

## 2017-07-13 DIAGNOSIS — M9902 Segmental and somatic dysfunction of thoracic region: Secondary | ICD-10-CM | POA: Diagnosis not present

## 2017-07-13 DIAGNOSIS — M546 Pain in thoracic spine: Secondary | ICD-10-CM | POA: Diagnosis not present

## 2017-07-14 DIAGNOSIS — M9903 Segmental and somatic dysfunction of lumbar region: Secondary | ICD-10-CM | POA: Diagnosis not present

## 2017-07-14 DIAGNOSIS — M546 Pain in thoracic spine: Secondary | ICD-10-CM | POA: Diagnosis not present

## 2017-07-14 DIAGNOSIS — M4316 Spondylolisthesis, lumbar region: Secondary | ICD-10-CM | POA: Diagnosis not present

## 2017-07-14 DIAGNOSIS — M9902 Segmental and somatic dysfunction of thoracic region: Secondary | ICD-10-CM | POA: Diagnosis not present

## 2017-07-20 DIAGNOSIS — M546 Pain in thoracic spine: Secondary | ICD-10-CM | POA: Diagnosis not present

## 2017-07-20 DIAGNOSIS — M9903 Segmental and somatic dysfunction of lumbar region: Secondary | ICD-10-CM | POA: Diagnosis not present

## 2017-07-20 DIAGNOSIS — M4316 Spondylolisthesis, lumbar region: Secondary | ICD-10-CM | POA: Diagnosis not present

## 2017-07-20 DIAGNOSIS — M9902 Segmental and somatic dysfunction of thoracic region: Secondary | ICD-10-CM | POA: Diagnosis not present

## 2017-07-22 DIAGNOSIS — M4316 Spondylolisthesis, lumbar region: Secondary | ICD-10-CM | POA: Diagnosis not present

## 2017-07-22 DIAGNOSIS — M9902 Segmental and somatic dysfunction of thoracic region: Secondary | ICD-10-CM | POA: Diagnosis not present

## 2017-07-22 DIAGNOSIS — M546 Pain in thoracic spine: Secondary | ICD-10-CM | POA: Diagnosis not present

## 2017-07-22 DIAGNOSIS — M9903 Segmental and somatic dysfunction of lumbar region: Secondary | ICD-10-CM | POA: Diagnosis not present

## 2017-07-27 DIAGNOSIS — M546 Pain in thoracic spine: Secondary | ICD-10-CM | POA: Diagnosis not present

## 2017-07-27 DIAGNOSIS — M9902 Segmental and somatic dysfunction of thoracic region: Secondary | ICD-10-CM | POA: Diagnosis not present

## 2017-07-27 DIAGNOSIS — M4316 Spondylolisthesis, lumbar region: Secondary | ICD-10-CM | POA: Diagnosis not present

## 2017-07-27 DIAGNOSIS — M9903 Segmental and somatic dysfunction of lumbar region: Secondary | ICD-10-CM | POA: Diagnosis not present

## 2017-07-29 DIAGNOSIS — M4316 Spondylolisthesis, lumbar region: Secondary | ICD-10-CM | POA: Diagnosis not present

## 2017-07-29 DIAGNOSIS — M546 Pain in thoracic spine: Secondary | ICD-10-CM | POA: Diagnosis not present

## 2017-07-29 DIAGNOSIS — M9902 Segmental and somatic dysfunction of thoracic region: Secondary | ICD-10-CM | POA: Diagnosis not present

## 2017-07-29 DIAGNOSIS — M9903 Segmental and somatic dysfunction of lumbar region: Secondary | ICD-10-CM | POA: Diagnosis not present

## 2017-07-30 ENCOUNTER — Telehealth: Payer: 59 | Admitting: Family

## 2017-07-30 DIAGNOSIS — N390 Urinary tract infection, site not specified: Secondary | ICD-10-CM | POA: Diagnosis not present

## 2017-07-30 MED ORDER — CEPHALEXIN 500 MG PO CAPS: 500.0000 mg | ORAL_CAPSULE | Freq: Two times a day (BID) | ORAL | 0 refills | Status: DC

## 2017-07-30 NOTE — Progress Notes (Signed)

## 2017-08-02 DIAGNOSIS — M546 Pain in thoracic spine: Secondary | ICD-10-CM | POA: Diagnosis not present

## 2017-08-02 DIAGNOSIS — M9903 Segmental and somatic dysfunction of lumbar region: Secondary | ICD-10-CM | POA: Diagnosis not present

## 2017-08-02 DIAGNOSIS — M9902 Segmental and somatic dysfunction of thoracic region: Secondary | ICD-10-CM | POA: Diagnosis not present

## 2017-08-02 DIAGNOSIS — M4316 Spondylolisthesis, lumbar region: Secondary | ICD-10-CM | POA: Diagnosis not present

## 2017-08-04 DIAGNOSIS — M546 Pain in thoracic spine: Secondary | ICD-10-CM | POA: Diagnosis not present

## 2017-08-04 DIAGNOSIS — M9902 Segmental and somatic dysfunction of thoracic region: Secondary | ICD-10-CM | POA: Diagnosis not present

## 2017-08-04 DIAGNOSIS — M9903 Segmental and somatic dysfunction of lumbar region: Secondary | ICD-10-CM | POA: Diagnosis not present

## 2017-08-04 DIAGNOSIS — M4316 Spondylolisthesis, lumbar region: Secondary | ICD-10-CM | POA: Diagnosis not present

## 2017-08-10 ENCOUNTER — Ambulatory Visit: Payer: Self-pay | Admitting: Plastic Surgery

## 2017-08-10 DIAGNOSIS — Z9012 Acquired absence of left breast and nipple: Secondary | ICD-10-CM

## 2017-08-10 DIAGNOSIS — N6489 Other specified disorders of breast: Secondary | ICD-10-CM

## 2017-08-10 NOTE — H&P (View-Only) (Signed)
Robin Riley is an 42 y.o. female.   Chief Complaint: acquired absence of left breast HPI: The patient is a 42 yrs old female here for post operative follow up after LEFT nipple sparing immediate breast reconstruction on 06/03/2017. She is doing well overall.  The incision is healing well.  No sign of infection or hematome.    History: She underwent a partial mastectomy in in 2017 for a concerning area on the mammogram.  This was found to be atypical ductal hyperplasia.  She did not have radiation.  She went for a routine mammogram again this year and abnormalities were found.  The path has showed ADH again but now with a 40% increase risk of cancer.  After long discussions and consideration with oncology and general surgery the patient has decided to follow the current advice for a left mastectomy.  She is 5 feet 3 inches tall, weight is 126 pounds.  Preop bra = 34A.    Past Medical History:  Diagnosis Date  . Acne    managed by Nehemiah Massed  . Adrenal tumor 2008   found during workup for hypertension  . Cancer ([place])   . Chest pain 2008   negative myoview, attributed to costochondritis  . Gastritis Sept 2008   mild, EGD [facility]   . L4 vertebral fracture (HCC)    remote    Past Surgical History:  Procedure Laterality Date  . BREAST LUMPECTOMY WITH RADIOACTIVE SEED LOCALIZATION Left 10/04/2014   Procedure: LEFT BREAST LUMPECTOMY WITH RADIOACTIVE SEED LOCALIZATION;  Surgeon: Autumn Messing III, MD;  Location: [place];  Service: General;  Laterality: Left;  . BREAST RECONSTRUCTION WITH PLACEMENT OF TISSUE EXPANDER AND FLEX HD (ACELLULAR HYDRATED DERMIS) Left 06/03/2017   Procedure: LEFT BREAST RECONSTRUCTION WITH PLACEMENT OF TISSUE EXPANDER AND FLEX HD (ACELLULAR HYDRATED DERMIS);  Surgeon: Wallace Going, DO;  Location: [place];  Service: Plastics;  Laterality: Left;  . ENDOMETRIAL ABLATION  2012  . NIPPLE SPARING MASTECTOMY Left 06/03/2017   Procedure: LEFT BREAST NIPPLE  SPARING MASTECTOMY;  Surgeon: Jovita Kussmaul, MD;  Location: [place];  Service: General;  Laterality: Left;  . TONSILLECTOMY      Family History  Problem Relation Age of Onset  . Osteoporosis Mother   . Hypertension Mother   . Heart disease Maternal Grandfather   . Osteoporosis Maternal Grandmother   . Diabetes Maternal Grandmother   . Cancer Maternal Aunt        ovarian   Social History:  reports that she has never smoked. She has never used smokeless tobacco. She reports that she does not drink alcohol or use drugs.  Allergies:  Allergies  Allergen Reactions  . Sulfa Antibiotics Rash     (Not in a hospital admission)  No results found for this or any previous visit (from the past 48 hour(s)). No results found.  Review of Systems  Constitutional: Negative.   HENT: Negative.   Eyes: Negative.   Respiratory: Negative.   Cardiovascular: Negative.   Gastrointestinal: Negative.   Genitourinary: Negative.   Musculoskeletal: Negative.   Skin: Negative.   Neurological: Negative.   Psychiatric/Behavioral: Negative.     There were no vitals taken for this visit. Physical Exam  Constitutional: She is oriented to person, place, and time. She appears well-developed and well-nourished.  HENT:  Head: Normocephalic and atraumatic.  Eyes: Pupils are equal, round, and reactive to light. EOM are normal.  Cardiovascular: Normal rate.  Respiratory: Effort normal.  GI:  Soft.  Neurological: She is alert and oriented to person, place, and time.  Skin: Skin is warm. No rash noted. No erythema.  Psychiatric: She has a normal mood and affect. Her behavior is normal. Judgment and thought content normal.     Assessment/Plan Plan no removal of left breast expander and placement of bilateral implants.  [place], DO 08/10/2017, 9:55 AM

## 2017-08-10 NOTE — H&P (Signed)
Robin Riley is an 42 y.o. female.   Chief Complaint: acquired absence of left breast HPI: The patient is a 41 yrs old female here for post operative follow up after LEFT nipple sparing immediate breast reconstruction on 06/03/2017. She is doing well overall.  The incision is healing well.  No sign of infection or hematome.    History: She underwent a partial mastectomy in in 2017 for a concerning area on the mammogram.  This was found to be atypical ductal hyperplasia.  She did not have radiation.  She went for a routine mammogram again this year and abnormalities were found.  The path has showed ADH again but now with a 40% increase risk of cancer.  After long discussions and consideration with oncology and general surgery the patient has decided to follow the current advice for a left mastectomy.  She is 5 feet 3 inches tall, weight is 126 pounds.  Preop bra = 34A.    Past Medical History:  Diagnosis Date  . Acne    managed by Nehemiah Massed  . Adrenal tumor 2008   found during workup for hypertension  . Cancer ([place])   . Chest pain 2008   negative myoview, attributed to costochondritis  . Gastritis Sept 2008   mild, EGD [facility]re[facility]   . L4 vertebral fracture ([facility])    remote    Past Surgical History:  Procedure Laterality Date  . BREAST LUMPECTOMY WITH RADIOACTIVE SEED LOCALIZATION Left 10/04/2014   Procedure: LEFT BREAST LUMPECTOMY WITH RADIOACTIVE SEED LOCALIZATION;  Surgeon: Autumn Messing III, MD;  Location: [place];  Service: General;  Laterality: Left;  . BREAST RECONSTRUCTION WITH PLACEMENT OF TISSUE EXPANDER AND FLEX HD (ACELLULAR HYDRATED DERMIS) Left 06/03/2017   Procedure: LEFT BREAST RECONSTRUCTION WITH PLACEMENT OF TISSUE EXPANDER AND FLEX HD (ACELLULAR HYDRATED DERMIS);  Surgeon: Wallace Going, DO;  Location: [place];  Service: Plastics;  Laterality: Left;  . ENDOMETRIAL ABLATION  2012  . NIPPLE SPARING MASTECTOMY Left 06/03/2017   Procedure: LEFT BREAST NIPPLE  SPARING MASTECTOMY;  Surgeon: Jovita Kussmaul, MD;  Location: [place];  Service: General;  Laterality: Left;  . TONSILLECTOMY      Family History  Problem Relation Age of Onset  . Osteoporosis Mother   . Hypertension Mother   . Heart disease Maternal Grandfather   . Osteoporosis Maternal Grandmother   . Diabetes Maternal Grandmother   . Cancer Maternal Aunt        ovarian   Social History:  reports that she has never smoked. She has never used smokeless tobacco. She reports that she does not drink alcohol or use drugs.  Allergies:  Allergies  Allergen Reactions  . Sulfa Antibiotics Rash     (Not in a hospital admission)  No results found for this or any previous visit (from the past 48 hour(s)). No results found.  Review of Systems  Constitutional: Negative.   HENT: Negative.   Eyes: Negative.   Respiratory: Negative.   Cardiovascular: Negative.   Gastrointestinal: Negative.   Genitourinary: Negative.   Musculoskeletal: Negative.   Skin: Negative.   Neurological: Negative.   Psychiatric/Behavioral: Negative.     There were no vitals taken for this visit. Physical Exam  Constitutional: She is oriented to person, place, and time. She appears well-developed and well-nourished.  HENT:  Head: Normocephalic and atraumatic.  Eyes: Pupils are equal, round, and reactive to light. EOM are normal.  Cardiovascular: Normal rate.  Respiratory: Effort normal.  GI:  Soft.  Neurological: She is alert and oriented to person, place, and time.  Skin: Skin is warm. No rash noted. No erythema.  Psychiatric: She has a normal mood and affect. Her behavior is normal. Judgment and thought content normal.     Assessment/Plan Plan no removal of left breast expander and placement of bilateral implants.  [place], DO 08/10/2017, 9:55 AM

## 2017-08-17 ENCOUNTER — Other Ambulatory Visit: Payer: Self-pay | Admitting: Internal Medicine

## 2017-08-17 DIAGNOSIS — E538 Deficiency of other specified B group vitamins: Secondary | ICD-10-CM

## 2017-08-18 MED ORDER — CYANOCOBALAMIN 1000 MCG/ML IJ SOLN: 1000.0000 ug | INTRAMUSCULAR | 0 refills | Status: DC

## 2017-08-25 ENCOUNTER — Ambulatory Visit: Payer: Self-pay | Admitting: Plastic Surgery

## 2017-08-27 ENCOUNTER — Other Ambulatory Visit: Payer: Self-pay

## 2017-08-27 ENCOUNTER — Encounter (HOSPITAL_BASED_OUTPATIENT_CLINIC_OR_DEPARTMENT_OTHER): Payer: Self-pay | Admitting: *Deleted

## 2017-08-27 ENCOUNTER — Ambulatory Visit: Payer: Self-pay | Admitting: Plastic Surgery

## 2017-08-31 NOTE — Anesthesia Preprocedure Evaluation (Signed)
Anesthesia Evaluation  Patient identified by MRN, date of birth, ID band Patient awake    Reviewed: Allergy & Precautions, NPO status , Patient's Chart, lab work & pertinent test results  Airway Mallampati: I  TM Distance: >3 FB Neck ROM: Full    Dental   Pulmonary    Pulmonary exam normal        Cardiovascular Normal cardiovascular exam     Neuro/Psych    GI/Hepatic   Endo/Other    Renal/GU      Musculoskeletal   Abdominal   Peds  Hematology   Anesthesia Other Findings   Reproductive/Obstetrics                             Anesthesia Physical  Anesthesia Plan  ASA: II  Anesthesia Plan: General   Post-op Pain Management:    Induction: Intravenous  PONV Risk Score and Plan: 3 and Ondansetron, Dexamethasone, Midazolam and Treatment may vary due to age or medical condition  Airway Management Planned: Oral ETT and LMA  Additional Equipment:   Intra-op Plan:   Post-operative Plan: Extubation in OR  Informed Consent: I have reviewed the patients History and Physical, chart, labs and discussed the procedure including the risks, benefits and alternatives for the proposed anesthesia with the patient or authorized representative who has indicated his/her understanding and acceptance.     Plan Discussed with: CRNA and Surgeon  Anesthesia Plan Comments:         Anesthesia Quick Evaluation

## 2017-09-01 ENCOUNTER — Ambulatory Visit (HOSPITAL_BASED_OUTPATIENT_CLINIC_OR_DEPARTMENT_OTHER)
Admission: RE | Admit: 2017-09-01 | Discharge: 2017-09-01 | Disposition: A | Discharge status: Home / Self Care | Payer: 59 | Source: Ambulatory Visit | Attending: Plastic Surgery | Admitting: Plastic Surgery

## 2017-09-01 ENCOUNTER — Ambulatory Visit (HOSPITAL_BASED_OUTPATIENT_CLINIC_OR_DEPARTMENT_OTHER): Payer: 59 | Admitting: Anesthesiology

## 2017-09-01 ENCOUNTER — Encounter (HOSPITAL_BASED_OUTPATIENT_CLINIC_OR_DEPARTMENT_OTHER)
Admission: RE | Disposition: A | Discharge status: Home / Self Care | Payer: Self-pay | Source: Ambulatory Visit | Attending: Plastic Surgery

## 2017-09-01 ENCOUNTER — Encounter (HOSPITAL_BASED_OUTPATIENT_CLINIC_OR_DEPARTMENT_OTHER): Payer: Self-pay | Admitting: *Deleted

## 2017-09-01 ENCOUNTER — Other Ambulatory Visit: Payer: Self-pay

## 2017-09-01 DIAGNOSIS — Z421 Encounter for breast reconstruction following mastectomy: Secondary | ICD-10-CM | POA: Diagnosis not present

## 2017-09-01 DIAGNOSIS — N651 Disproportion of reconstructed breast: Secondary | ICD-10-CM | POA: Diagnosis not present

## 2017-09-01 DIAGNOSIS — N6092 Unspecified benign mammary dysplasia of left breast: Secondary | ICD-10-CM | POA: Insufficient documentation

## 2017-09-01 DIAGNOSIS — Z882 Allergy status to sulfonamides status: Secondary | ICD-10-CM | POA: Diagnosis present

## 2017-09-01 DIAGNOSIS — N6489 Other specified disorders of breast: Secondary | ICD-10-CM

## 2017-09-01 DIAGNOSIS — Z9012 Acquired absence of left breast and nipple: Secondary | ICD-10-CM | POA: Insufficient documentation

## 2017-09-01 DIAGNOSIS — Z853 Personal history of malignant neoplasm of breast: Secondary | ICD-10-CM | POA: Diagnosis not present

## 2017-09-01 DIAGNOSIS — K2970 Gastritis, unspecified, without bleeding: Secondary | ICD-10-CM | POA: Diagnosis not present

## 2017-09-01 DIAGNOSIS — T8579XA Infection and inflammatory reaction due to other internal prosthetic devices, implants and grafts, initial encounter: Secondary | ICD-10-CM | POA: Diagnosis not present

## 2017-09-01 HISTORY — PX: REMOVAL OF TISSUE EXPANDER AND PLACEMENT OF IMPLANT: SHX6457

## 2017-09-01 HISTORY — PX: PLACEMENT OF BREAST IMPLANTS: SHX6334

## 2017-09-01 SURGERY — REMOVAL, TISSUE EXPANDER, BREAST, WITH IMPLANT INSERTION
Anesthesia: General | Site: Breast | Laterality: Right

## 2017-09-01 MED ORDER — DEXAMETHASONE SODIUM PHOSPHATE 4 MG/ML IJ SOLN
INTRAMUSCULAR | Status: DC | PRN
  Administered 2017-09-01: 10 mg via INTRAVENOUS

## 2017-09-01 MED ORDER — FENTANYL CITRATE (PF) 100 MCG/2ML IJ SOLN
50.0000 ug | INTRAMUSCULAR | Status: AC | PRN
  Administered 2017-09-01 (×3): 50 ug via INTRAVENOUS
  Administered 2017-09-01: 100 ug via INTRAVENOUS
  Administered 2017-09-01: 50 ug via INTRAVENOUS

## 2017-09-01 MED ORDER — EPHEDRINE SULFATE 50 MG/ML IJ SOLN
INTRAMUSCULAR | Status: AC
  Filled 2017-09-01: qty 1

## 2017-09-01 MED ORDER — LIDOCAINE HCL (CARDIAC) PF 100 MG/5ML IV SOSY
PREFILLED_SYRINGE | INTRAVENOUS | Status: DC | PRN
Start: 2017-09-01 — End: 2017-09-01
  Administered 2017-09-01: 75 mg via INTRAVENOUS

## 2017-09-01 MED ORDER — SUCCINYLCHOLINE CHLORIDE 200 MG/10ML IV SOSY
PREFILLED_SYRINGE | INTRAVENOUS | Status: AC
  Filled 2017-09-01: qty 10

## 2017-09-01 MED ORDER — PROPOFOL 10 MG/ML IV BOLUS
INTRAVENOUS | Status: DC | PRN
  Administered 2017-09-01: 150 mg via INTRAVENOUS

## 2017-09-01 MED ORDER — MIDAZOLAM HCL 2 MG/2ML IJ SOLN
INTRAMUSCULAR | Status: AC
  Filled 2017-09-01: qty 2

## 2017-09-01 MED ORDER — EPHEDRINE SULFATE 50 MG/ML IJ SOLN
INTRAMUSCULAR | Status: DC | PRN
  Administered 2017-09-01: 10 mg via INTRAVENOUS

## 2017-09-01 MED ORDER — ACETAMINOPHEN 650 MG RE SUPP: 650.0000 mg | RECTAL | Status: DC | PRN

## 2017-09-01 MED ORDER — ONDANSETRON HCL 4 MG/2ML IJ SOLN: 4.0000 mg | Freq: Once | INTRAMUSCULAR | Status: DC | PRN

## 2017-09-01 MED ORDER — ACETAMINOPHEN 160 MG/5ML PO SOLN: 325.0000 mg | ORAL | Status: DC | PRN

## 2017-09-01 MED ORDER — SODIUM CHLORIDE 0.9 % IV SOLN: 250.0000 mL | INTRAVENOUS | Status: DC | PRN

## 2017-09-01 MED ORDER — FENTANYL CITRATE (PF) 100 MCG/2ML IJ SOLN
INTRAMUSCULAR | Status: AC
  Filled 2017-09-01: qty 2

## 2017-09-01 MED ORDER — MEPERIDINE HCL 25 MG/ML IJ SOLN: 6.2500 mg | INTRAMUSCULAR | Status: DC | PRN

## 2017-09-01 MED ORDER — SCOPOLAMINE 1 MG/3DAYS TD PT72
MEDICATED_PATCH | TRANSDERMAL | Status: AC
  Filled 2017-09-01: qty 1

## 2017-09-01 MED ORDER — ACETAMINOPHEN 325 MG PO TABS
325.0000 mg | ORAL_TABLET | ORAL | Status: DC | PRN
  Administered 2017-09-01: 325 mg via ORAL

## 2017-09-01 MED ORDER — ONDANSETRON HCL 4 MG/2ML IJ SOLN
INTRAMUSCULAR | Status: AC
  Filled 2017-09-01: qty 2

## 2017-09-01 MED ORDER — ACETAMINOPHEN 325 MG PO TABS
650.0000 mg | ORAL_TABLET | ORAL | Status: DC | PRN
Start: 2017-09-01 — End: 2017-09-01

## 2017-09-01 MED ORDER — SCOPOLAMINE 1 MG/3DAYS TD PT72
1.0000 | MEDICATED_PATCH | Freq: Once | TRANSDERMAL | Status: DC | PRN
  Administered 2017-09-01: 1.5 mg via TRANSDERMAL

## 2017-09-01 MED ORDER — OXYCODONE HCL 5 MG PO TABS: 5.0000 mg | ORAL_TABLET | ORAL | Status: DC | PRN

## 2017-09-01 MED ORDER — SODIUM CHLORIDE 0.9 % IV SOLN
INTRAVENOUS | Status: DC | PRN
  Administered 2017-09-01: 500 mL

## 2017-09-01 MED ORDER — BUPIVACAINE-EPINEPHRINE 0.25% -1:200000 IJ SOLN
INTRAMUSCULAR | Status: DC | PRN
  Administered 2017-09-01: 15 mL

## 2017-09-01 MED ORDER — OXYCODONE HCL 5 MG/5ML PO SOLN: 5.0000 mg | Freq: Once | ORAL | Status: DC | PRN

## 2017-09-01 MED ORDER — ACETAMINOPHEN 325 MG PO TABS
ORAL_TABLET | ORAL | Status: AC
  Filled 2017-09-01: qty 1

## 2017-09-01 MED ORDER — CEFAZOLIN SODIUM-DEXTROSE 2-4 GM/100ML-% IV SOLN
2.0000 g | INTRAVENOUS | Status: AC
  Administered 2017-09-01: 2 g via INTRAVENOUS

## 2017-09-01 MED ORDER — MIDAZOLAM HCL 2 MG/2ML IJ SOLN
1.0000 mg | INTRAMUSCULAR | Status: DC | PRN
  Administered 2017-09-01: 2 mg via INTRAVENOUS

## 2017-09-01 MED ORDER — PHENYLEPHRINE 40 MCG/ML (10ML) SYRINGE FOR IV PUSH (FOR BLOOD PRESSURE SUPPORT)
PREFILLED_SYRINGE | INTRAVENOUS | Status: AC
  Filled 2017-09-01: qty 10

## 2017-09-01 MED ORDER — LACTATED RINGERS IV SOLN
INTRAVENOUS | Status: DC
  Administered 2017-09-01: 10 mL/h via INTRAVENOUS
  Administered 2017-09-01: 09:00:00 via INTRAVENOUS

## 2017-09-01 MED ORDER — FENTANYL CITRATE (PF) 100 MCG/2ML IJ SOLN: 25.0000 ug | INTRAMUSCULAR | Status: DC | PRN

## 2017-09-01 MED ORDER — SODIUM CHLORIDE 0.9% FLUSH: 3.0000 mL | Freq: Two times a day (BID) | INTRAVENOUS | Status: DC

## 2017-09-01 MED ORDER — SODIUM CHLORIDE 0.9% FLUSH: 3.0000 mL | INTRAVENOUS | Status: DC | PRN

## 2017-09-01 MED ORDER — LIDOCAINE HCL (CARDIAC) PF 100 MG/5ML IV SOSY
PREFILLED_SYRINGE | INTRAVENOUS | Status: AC
  Filled 2017-09-01: qty 5

## 2017-09-01 MED ORDER — DEXAMETHASONE SODIUM PHOSPHATE 10 MG/ML IJ SOLN
INTRAMUSCULAR | Status: AC
  Filled 2017-09-01: qty 1

## 2017-09-01 MED ORDER — CEFAZOLIN SODIUM-DEXTROSE 2-4 GM/100ML-% IV SOLN
INTRAVENOUS | Status: AC
  Filled 2017-09-01: qty 100

## 2017-09-01 MED ORDER — OXYCODONE HCL 5 MG PO TABS: 5.0000 mg | ORAL_TABLET | Freq: Once | ORAL | Status: DC | PRN

## 2017-09-01 SURGICAL SUPPLY — 81 items
ADH SKN CLS APL DERMABOND .7 (GAUZE/BANDAGES/DRESSINGS) ×4
BAG DECANTER FOR FLEXI CONT (MISCELLANEOUS) ×4 IMPLANT
BINDER BREAST LRG (GAUZE/BANDAGES/DRESSINGS) ×4 IMPLANT
BINDER BREAST MEDIUM (GAUZE/BANDAGES/DRESSINGS) IMPLANT
BINDER BREAST XLRG (GAUZE/BANDAGES/DRESSINGS) IMPLANT
BINDER BREAST XXLRG (GAUZE/BANDAGES/DRESSINGS) IMPLANT
BIOPATCH RED 1 DISK 7.0 (GAUZE/BANDAGES/DRESSINGS) IMPLANT
BIOPATCH RED 1IN DISK 7.0MM (GAUZE/BANDAGES/DRESSINGS)
BLADE HEX COATED 2.75 (ELECTRODE) ×4 IMPLANT
BLADE SURG 15 STRL LF DISP TIS (BLADE) ×4 IMPLANT
BLADE SURG 15 STRL SS (BLADE) ×8
BNDG GAUZE ELAST 4 BULKY (GAUZE/BANDAGES/DRESSINGS) IMPLANT
CANISTER SUCT 1200ML W/VALVE (MISCELLANEOUS) ×4 IMPLANT
CHLORAPREP W/TINT 26ML (MISCELLANEOUS) ×4 IMPLANT
CORD BIPOLAR FORCEPS 12FT (ELECTRODE) IMPLANT
COVER BACK TABLE 60X90IN (DRAPES) ×4 IMPLANT
COVER MAYO STAND STRL (DRAPES) ×4 IMPLANT
DECANTER SPIKE VIAL GLASS SM (MISCELLANEOUS) IMPLANT
DERMABOND ADVANCED (GAUZE/BANDAGES/DRESSINGS) ×4
DERMABOND ADVANCED .7 DNX12 (GAUZE/BANDAGES/DRESSINGS) ×4 IMPLANT
DRAIN CHANNEL 19F RND (DRAIN) IMPLANT
DRAPE LAPAROSCOPIC ABDOMINAL (DRAPES) ×4 IMPLANT
DRSG PAD ABDOMINAL 8X10 ST (GAUZE/BANDAGES/DRESSINGS) ×8 IMPLANT
ELECT BLADE 4.0 EZ CLEAN MEGAD (MISCELLANEOUS) ×4
ELECT BLADE 6.5 EXT (BLADE) IMPLANT
ELECT REM PT RETURN 9FT ADLT (ELECTROSURGICAL) ×4
ELECTRODE BLDE 4.0 EZ CLN MEGD (MISCELLANEOUS) ×2 IMPLANT
ELECTRODE REM PT RTRN 9FT ADLT (ELECTROSURGICAL) ×2 IMPLANT
EVACUATOR SILICONE 100CC (DRAIN) IMPLANT
GAUZE SPONGE 4X4 12PLY STRL LF (GAUZE/BANDAGES/DRESSINGS) IMPLANT
GLOVE BIO SURGEON STRL SZ 6.5 (GLOVE) ×6 IMPLANT
GLOVE BIO SURGEON STRL SZ7 (GLOVE) ×4 IMPLANT
GLOVE BIO SURGEONS STRL SZ 6.5 (GLOVE) ×2
GOWN STRL REUS W/ TWL LRG LVL3 (GOWN DISPOSABLE) ×2 IMPLANT
GOWN STRL REUS W/ TWL XL LVL3 (GOWN DISPOSABLE) ×2 IMPLANT
GOWN STRL REUS W/TWL LRG LVL3 (GOWN DISPOSABLE) ×4
GOWN STRL REUS W/TWL XL LVL3 (GOWN DISPOSABLE) ×4
IMPL GEL HI PROFILE 325CC (Breast) ×2 IMPLANT
IMPL SILICONE HI PROF 250CC (Breast) ×2 IMPLANT
IMPLANT GEL HI PROFILE 325CC (Breast) ×4 IMPLANT
IMPLANT SILICONE HI PROF 250CC (Breast) ×4 IMPLANT
IV NS 1000ML (IV SOLUTION)
IV NS 1000ML BAXH (IV SOLUTION) IMPLANT
IV NS 500ML (IV SOLUTION)
IV NS 500ML BAXH (IV SOLUTION) IMPLANT
KIT FILL SYSTEM UNIVERSAL (SET/KITS/TRAYS/PACK) IMPLANT
NDL SAFETY ECLIPSE 18X1.5 (NEEDLE) ×2 IMPLANT
NEEDLE HYPO 18GX1.5 SHARP (NEEDLE) ×4
NEEDLE HYPO 25X1 1.5 SAFETY (NEEDLE) ×4 IMPLANT
NEEDLE SPNL 18GX3.5 QUINCKE PK (NEEDLE) IMPLANT
NS IRRIG 1000ML POUR BTL (IV SOLUTION) IMPLANT
PACK BASIN DAY SURGERY FS (CUSTOM PROCEDURE TRAY) ×4 IMPLANT
PENCIL BUTTON HOLSTER BLD 10FT (ELECTRODE) ×4 IMPLANT
PIN SAFETY STERILE (MISCELLANEOUS) IMPLANT
SIZER BREAST GEL REUSE 300CC (SIZER) ×4
SIZER BREAST GEL REUSE 325CC (SIZER) ×4
SIZER BREAST REUSE 275CC (SIZER) ×4
SIZER BRST GEL REUSE 300CC (SIZER) ×2 IMPLANT
SIZER BRST GEL REUSE 325CC (SIZER) ×2 IMPLANT
SIZER BRST REUSE 275CC (SIZER) ×2 IMPLANT
SLEEVE SCD COMPRESS KNEE MED (MISCELLANEOUS) ×4 IMPLANT
SPONGE LAP 18X18 RF (DISPOSABLE) ×8 IMPLANT
SUT MNCRL AB 4-0 PS2 18 (SUTURE) ×8 IMPLANT
SUT MON AB 3-0 SH 27 (SUTURE) ×8
SUT MON AB 3-0 SH27 (SUTURE) ×4 IMPLANT
SUT MON AB 5-0 PS2 18 (SUTURE) ×8 IMPLANT
SUT PDS 3-0 CT2 (SUTURE)
SUT PDS AB 2-0 CT2 27 (SUTURE) IMPLANT
SUT PDS II 3-0 CT2 27 ABS (SUTURE) IMPLANT
SUT SILK 3 0 PS 1 (SUTURE) IMPLANT
SUT VIC AB 3-0 SH 27 (SUTURE)
SUT VIC AB 3-0 SH 27X BRD (SUTURE) IMPLANT
SUT VICRYL 4-0 PS2 18IN ABS (SUTURE) IMPLANT
SYR 50ML LL SCALE MARK (SYRINGE) IMPLANT
SYR BULB IRRIGATION 50ML (SYRINGE) ×4 IMPLANT
SYR CONTROL 10ML LL (SYRINGE) ×4 IMPLANT
TOWEL GREEN STERILE FF (TOWEL DISPOSABLE) ×8 IMPLANT
TUBE CONNECTING 20'X1/4 (TUBING) ×1
TUBE CONNECTING 20X1/4 (TUBING) ×3 IMPLANT
UNDERPAD 30X30 (UNDERPADS AND DIAPERS) ×8 IMPLANT
YANKAUER SUCT BULB TIP NO VENT (SUCTIONS) ×4 IMPLANT

## 2017-09-01 NOTE — Anesthesia Procedure Notes (Signed)
Procedure Name: LMA Insertion Date/Time: 09/01/2017 8:34 AM Performed by: Willa Frater, CRNA Pre-anesthesia Checklist: Patient identified, Emergency Drugs available, Suction available and Patient being monitored Patient Re-evaluated:Patient Re-evaluated prior to induction Oxygen Delivery Method: Circle system utilized Preoxygenation: Pre-oxygenation with 100% oxygen Induction Type: IV induction Ventilation: Mask ventilation without difficulty LMA: LMA inserted LMA Size: 4.0 Number of attempts: 1 Airway Equipment and Method: Bite block Placement Confirmation: positive ETCO2 Tube secured with: Tape Dental Injury: Teeth and Oropharynx as per pre-operative assessment

## 2017-09-01 NOTE — Interval H&P Note (Signed)
History and Physical Interval Note:  09/01/2017 7:59 AM  Robin Riley  has presented today for surgery, with the diagnosis of Acquired absence of left breast, Atypical ductal hyperplasia of left breast  The various methods of treatment have been discussed with the patient and family. After consideration of risks, benefits and other options for treatment, the patient has consented to  Procedure(s): REMOVAL OF LEFT TISSUE EXPANDERS WITH PLACEMENT OF LEFT BREAST IMPLANT (Left) PLACEMENT OF BREAST IMPLANT FOR SYMMETRY (Right) as a surgical intervention .  The patient's history has been reviewed, patient examined, no change in status, stable for surgery.  I have reviewed the patient's chart and labs.  Questions were answered to the patient's satisfaction.     Loel Lofty Dillingham

## 2017-09-01 NOTE — Addendum Note (Signed)
Addendum  created 09/01/17 1121 by Willa Frater, CRNA   Intraprocedure Flowsheets edited

## 2017-09-01 NOTE — Op Note (Signed)
Op report Unilateral Breast Exchange   DATE OF OPERATION:  09/01/2017  LOCATION: [facility]  SURGICAL DIVISION: Plastic Surgery  PREOPERATIVE DIAGNOSES:  1. History of left breast disease.  2. Acquired absence of left breast.  3. Breast asymmetry after reconstruction.  POSTOPERATIVE DIAGNOSES:  1. History of left breast disease.  2. Acquired absence of left breast.  3. Breast asymmetry after reconstruction.  PROCEDURE:  1. Exchange of left tissue expander for implant. 2. Capsulotomies for implant respositioning. 3. Placement of right breast implant for symmetry  SURGEON: Lerlene Treadwell Sanger Jonee Lamore, DO  ANESTHESIA:  General.   COMPLICATIONS: None.   IMPLANTS: Left:   Mentor Smooth Round High Profile Gel 325cc. Ref #[id number].  Serial Number [id number] Right:  Mentor Smooth Round High Profile Gel 250cc. Ref #[id number].  Serial Number [id number]  INDICATIONS FOR PROCEDURE:  The patient, Robin Riley, is a 42 y.o. female born on 07-09-1975, is here for treatment for further treatment after a mastectomy and placement of a tissue expander. She now presents for exchange of her expander for an implant.  She requires capsulotomies to better position the implant. MRN: [id number]  CONSENT:  Informed consent was obtained directly from the patient. Risks, benefits and alternatives were fully discussed. Specific risks including but not limited to bleeding, infection, hematoma, seroma, scarring, pain, implant infection, implant extrusion, capsular contracture, asymmetry, wound healing problems, and need for further surgery were all discussed. The patient did have an ample opportunity to have her questions answered to her satisfaction.   DESCRIPTION OF PROCEDURE:  The patient was taken to the operating room. SCDs were placed and IV antibiotics were given. The patient's chest was prepped and draped in a sterile fashion. A time out was performed and the implants  to be used were identified.  One percent Xylocaine with epinephrine was used to infiltrate the area.   LEFT: The old mastectomy scar was opened and superior mastectomy and inferior mastectomy flaps were re-raised over the pectoralis major muscle. The pectoralis was split to expose the tissue expander which was removed. Inspection of the pocket showed a normal healthy capsule and good integration of the biologic matrix.   Circumferential capsulotomies were performed to allow for breast pocket expansion.  Measurements were made to confirm adequate pocket size for the implant dimensions.  Hemostasis was ensured with electrocautery.  The pocket was irrigated with antibiotic solution.  New gloves were placed.  The implant was placed in the pocket and oriented appropriately. The pectoralis major muscle and capsule on the anterior surface were re-closed with a 3-0 running Monocryl suture. The remaining skin was closed with 4-0 Monocryl deep dermal and 5-0 Monocryl subcuticular stitches.    RIGHT:  The local was injected at the inframammary fold for 2.5 cm.  The #15 blade was used to make an incision and the bovie used to dissect to the pectoralis muscle.  The muscle was then released from the pectoralis minor to create a space for the implant via a dual plane.  The sizer was placed and the appropriate implant selected for symmetry. Hemostasis was achieved with electrocautery.  The implant was placed.  The deep layers were closed with the 3-0 Monocryl followed by the 4-0 and 5-0 Monocryl.   Dermabond was applied.  A breast binder and ABD was applied.  The patient was allowed to wake from anesthesia and taken to the recovery room in satisfactory condition.

## 2017-09-01 NOTE — Discharge Instructions (Signed)
No heavy lifting. May shower tomorrow. Continue the binder or sports bra.   Post Anesthesia Home Care Instructions  Activity: Get plenty of rest for the remainder of the day. A responsible individual must stay with you for 24 hours following the procedure.  For the next 24 hours, DO NOT: -Drive a car -Paediatric nurse -Drink alcoholic beverages -Take any medication unless instructed by your physician -Make any legal decisions or sign important papers.  Meals: Start with liquid foods such as gelatin or soup. Progress to regular foods as tolerated. Avoid greasy, spicy, heavy foods. If nausea and/or vomiting occur, drink only clear liquids until the nausea and/or vomiting subsides. Call your physician if vomiting continues.  Special Instructions/Symptoms: Your throat may feel dry or sore from the anesthesia or the breathing tube placed in your throat during surgery. If this causes discomfort, gargle with warm salt water. The discomfort should disappear within 24 hours.  If you had a scopolamine patch placed behind your ear for the management of post- operative nausea and/or vomiting:  1. The medication in the patch is effective for 72 hours, after which it should be removed.  Wrap patch in a tissue and discard in the trash. Wash hands thoroughly with soap and water. 2. You may remove the patch earlier than 72 hours if you experience unpleasant side effects which may include dry mouth, dizziness or visual disturbances. 3. Avoid touching the patch. Wash your hands with soap and water after contact with the patch.

## 2017-09-01 NOTE — Anesthesia Postprocedure Evaluation (Signed)
Anesthesia Post Note  Patient: Robin Riley  Procedure(s) Performed: REMOVAL OF LEFT TISSUE EXPANDER WITH PLACEMENT OF LEFT BREAST IMPLANT (Left Breast) PLACEMENT OF BREAST IMPLANT FOR SYMMETRY (Right Breast)     Patient location during evaluation: PACU Anesthesia Type: General Level of consciousness: awake and alert Pain management: pain level controlled Vital Signs Assessment: post-procedure vital signs reviewed and stable Respiratory status: spontaneous breathing, nonlabored ventilation, respiratory function stable and patient connected to nasal cannula oxygen Cardiovascular status: blood pressure returned to baseline and stable Postop Assessment: no apparent nausea or vomiting Anesthetic complications: no    Last Vitals:  Vitals:   09/01/17 1045 09/01/17 1100  BP: 114/76 126/73  Pulse: 84 100  Resp: (!) 21 16  Temp:    SpO2: 100% 98%    Last Pain:  Vitals:   09/01/17 1100  TempSrc:   PainSc: 0-No pain                 Mertha Clyatt

## 2017-09-01 NOTE — Transfer of Care (Signed)
Immediate Anesthesia Transfer of Care Note  Patient: Robin Riley  Procedure(s) Performed: REMOVAL OF LEFT TISSUE EXPANDER WITH PLACEMENT OF LEFT BREAST IMPLANT (Left Breast) PLACEMENT OF BREAST IMPLANT FOR SYMMETRY (Right Breast)  Patient Location: PACU  Anesthesia Type:General  Level of Consciousness: awake, alert  and drowsy  Airway & Oxygen Therapy: Patient Spontanous Breathing and Patient connected to face mask oxygen  Post-op Assessment: Report given to RN and Post -op Vital signs reviewed and stable  Post vital signs: Reviewed and stable  Last Vitals:  Vitals Value Taken Time  BP    Temp    Pulse 96 09/01/2017 10:32 AM  Resp 21 09/01/2017 10:32 AM  SpO2 100 % 09/01/2017 10:32 AM  Vitals shown include unvalidated device data.  Last Pain:  Vitals:   09/01/17 0735  TempSrc: Oral         Complications: No apparent anesthesia complications

## 2017-09-03 ENCOUNTER — Encounter (HOSPITAL_BASED_OUTPATIENT_CLINIC_OR_DEPARTMENT_OTHER): Payer: Self-pay | Admitting: Plastic Surgery

## 2017-10-27 DIAGNOSIS — Z113 Encounter for screening for infections with a predominantly sexual mode of transmission: Secondary | ICD-10-CM | POA: Diagnosis not present

## 2017-10-27 DIAGNOSIS — B373 Candidiasis of vulva and vagina: Secondary | ICD-10-CM | POA: Diagnosis not present

## 2017-11-12 NOTE — Telephone Encounter (Addendum)
Chart has been updated.

## 2017-12-15 DIAGNOSIS — D2230 Melanocytic nevi of unspecified part of face: Secondary | ICD-10-CM | POA: Diagnosis not present

## 2017-12-15 DIAGNOSIS — L99 Other disorders of skin and subcutaneous tissue in diseases classified elsewhere: Secondary | ICD-10-CM | POA: Diagnosis not present

## 2017-12-15 DIAGNOSIS — L812 Freckles: Secondary | ICD-10-CM | POA: Diagnosis not present

## 2017-12-15 DIAGNOSIS — Z86018 Personal history of other benign neoplasm: Secondary | ICD-10-CM

## 2017-12-15 DIAGNOSIS — D229 Melanocytic nevi, unspecified: Secondary | ICD-10-CM | POA: Diagnosis not present

## 2017-12-15 DIAGNOSIS — L578 Other skin changes due to chronic exposure to nonionizing radiation: Secondary | ICD-10-CM | POA: Diagnosis not present

## 2017-12-15 DIAGNOSIS — D485 Neoplasm of uncertain behavior of skin: Secondary | ICD-10-CM | POA: Diagnosis not present

## 2017-12-15 DIAGNOSIS — D225 Melanocytic nevi of trunk: Secondary | ICD-10-CM | POA: Diagnosis not present

## 2017-12-15 DIAGNOSIS — D2271 Melanocytic nevi of right lower limb, including hip: Secondary | ICD-10-CM | POA: Diagnosis not present

## 2017-12-15 HISTORY — DX: Personal history of other benign neoplasm: Z86.018

## 2017-12-20 DIAGNOSIS — N6092 Unspecified benign mammary dysplasia of left breast: Secondary | ICD-10-CM | POA: Diagnosis not present

## 2018-01-04 DIAGNOSIS — L720 Epidermal cyst: Secondary | ICD-10-CM | POA: Diagnosis not present

## 2018-01-17 ENCOUNTER — Other Ambulatory Visit: Payer: Self-pay | Admitting: Obstetrics & Gynecology

## 2018-01-17 DIAGNOSIS — Z1231 Encounter for screening mammogram for malignant neoplasm of breast: Secondary | ICD-10-CM

## 2018-02-17 DIAGNOSIS — H04122 Dry eye syndrome of left lacrimal gland: Secondary | ICD-10-CM | POA: Diagnosis not present

## 2018-03-17 ENCOUNTER — Ambulatory Visit (INDEPENDENT_AMBULATORY_CARE_PROVIDER_SITE_OTHER): Payer: 59 | Admitting: Internal Medicine

## 2018-03-17 ENCOUNTER — Other Ambulatory Visit: Payer: 59

## 2018-03-17 ENCOUNTER — Telehealth: Payer: Self-pay

## 2018-03-17 ENCOUNTER — Encounter: Payer: Self-pay | Admitting: Internal Medicine

## 2018-03-17 VITALS — BP 110/72 | HR 80 | Temp 97.9°F | Resp 14 | Ht 63.0 in | Wt 130.2 lb

## 2018-03-17 DIAGNOSIS — R5383 Other fatigue: Secondary | ICD-10-CM

## 2018-03-17 DIAGNOSIS — Z0000 Encounter for general adult medical examination without abnormal findings: Secondary | ICD-10-CM

## 2018-03-17 DIAGNOSIS — Z124 Encounter for screening for malignant neoplasm of cervix: Secondary | ICD-10-CM | POA: Diagnosis not present

## 2018-03-17 DIAGNOSIS — L689 Hypertrichosis, unspecified: Secondary | ICD-10-CM | POA: Diagnosis not present

## 2018-03-17 DIAGNOSIS — Z792 Long term (current) use of antibiotics: Secondary | ICD-10-CM | POA: Diagnosis not present

## 2018-03-17 DIAGNOSIS — N6092 Unspecified benign mammary dysplasia of left breast: Secondary | ICD-10-CM

## 2018-03-17 DIAGNOSIS — E538 Deficiency of other specified B group vitamins: Secondary | ICD-10-CM | POA: Diagnosis not present

## 2018-03-17 DIAGNOSIS — Z9889 Other specified postprocedural states: Secondary | ICD-10-CM

## 2018-03-17 DIAGNOSIS — Z9012 Acquired absence of left breast and nipple: Secondary | ICD-10-CM

## 2018-03-17 MED ORDER — CYANOCOBALAMIN 1000 MCG/ML IJ SOLN: 1000.0000 ug | INTRAMUSCULAR | 1 refills | Status: DC

## 2018-03-17 NOTE — Progress Notes (Signed)
Patient ID: Robin Riley, female    DOB: 10/31/1975  Age: 43 y.o. MRN: [id number]  The patient is here for annualwellness examination and management of other chronic and acute problems.   The risk factors are reflected in the social history.  The roster of all physicians providing medical care to patient - is listed in the Snapshot section of the chart.  Activities of daily living:  The patient is 100% independent in all ADLs: dressing, toileting, feeding as well as independent mobility  Home safety : The patient has smoke detectors in the home. They wear seatbelts.  There are no firearms at home. There is no violence in the home.   There is no risks for hepatitis, STDs or HIV. There is no   history of blood transfusion. They have no travel history to infectious disease endemic areas of the world.  The patient has seen their dentist in the last six month. They have seen their eye doctor in the last year. They admit to slight hearing difficulty with regard to whispered voices and some television programs.  They have deferred audiologic testing in the last year.  They do not  have excessive sun exposure. Discussed the need for sun protection: hats, long sleeves and use of sunscreen if there is significant sun exposure.   Diet: the importance of a healthy diet is discussed. They do have a healthy diet.  The benefits of regular aerobic exercise were discussed. She exercises 4 times per week ,  60 minutes.   Depression screen: there are no signs or vegative symptoms of depression- irritability, change in appetite, anhedonia, sadness/tearfullness.  Cognitive assessment: the patient manages all their financial and personal affairs and is actively engaged. They could relate day,date,year and events; recalled 2/3 objects at 3 minutes; performed clock-face test normally.  The following portions of the patient's history were reviewed and updated as appropriate: allergies, current medications, past  family history, past medical history,  past surgical history, past social history  and problem list.  Visual acuity was not assessed per patient preference since she has regular follow up with her ophthalmologist. Hearing and body mass index were assessed and reviewed.   During the course of the visit the patient was educated and counseled about appropriate screening and preventive services including : fall prevention , diabetes screening, nutrition counseling, colorectal cancer screening, and recommended immunizations.    CC: The primary encounter diagnosis was Encounter for preventive health examination. Diagnoses of Excess body and facial hair, B12 deficiency, Fatigue, unspecified type, Preventive antibiotic, H/O mastectomy, left, History of augmentation of right breast, Atypical ductal hyperplasia of left breast, and Screening for cervical cancer were also pertinent to this visit.  1) excess facial hair  .  She has requested testing for PCOS based on the advice of a friend.  She denies deepening of voice, excess body hair, and irregular periods.   History Robin Riley has a past medical history of Acne, Adrenal tumor (2008), Cancer [facility]), Chest pain (2008), Gastritis (Sept 2008), and L4 vertebral fracture ([place]).   She has a past surgical history that includes Endometrial ablation (2012); Tonsillectomy; Breast lumpectomy with radioactive seed localization (Left, 10/04/2014); Nipple sparing mastectomy (Left, 06/03/2017); Breast reconstruction with placement of tissue expander and flex hd (acellular hydrated dermis) (Left, 06/03/2017); Mastectomy; Removal of tissue expander and placement of implant (Left, 09/01/2017); and Placement of breast implants (Right, 09/01/2017).   Her family history includes Cancer in her maternal aunt; Diabetes in her maternal grandmother; Heart disease  in her maternal grandfather; Hypertension in her mother; Osteoporosis in her maternal grandmother and mother.She reports that she has  never smoked. She has never used smokeless tobacco. She reports that she does not drink alcohol or use drugs.  Outpatient Medications Prior to Visit  Medication Sig Dispense Refill  . Artificial Tear Solution (SYSTANE CONTACTS OP) Place 2 drops into the left eye daily as needed (dry eye).    . calcium citrate-vitamin D 200-200 MG-UNIT TABS Take 1 tablet by mouth daily.    . vitamin C (ASCORBIC ACID) 500 MG tablet Take 500 mg by mouth daily.    . cyanocobalamin (,VITAMIN B-12,) 1000 MCG/ML injection Inject 1 mL (1,000 mcg total) into the muscle every 30 (thirty) days. 10 mL 0   No facility-administered medications prior to visit.     Review of Systems  Patient denies headache, fevers, malaise, unintentional weight loss, skin rash, eye pain, sinus congestion and sinus pain, sore throat, dysphagia,  hemoptysis , cough, dyspnea, wheezing, chest pain, palpitations, orthopnea, edema, abdominal pain, nausea, melena, diarrhea, constipation, flank pain, dysuria, hematuria, urinary  Frequency, nocturia, numbness, tingling, seizures,  Focal weakness, Loss of consciousness,  Tremor, insomnia, depression, anxiety, and suicidal ideation.     Objective:  BP 110/72 (BP Location: Left Arm, Patient Position: Sitting, Cuff Size: Normal)   Pulse 80   Temp 97.9 F (36.6 C) (Oral)   Resp 14   Ht 5\' 3"  (1.6 m)   Wt 130 lb 3.2 oz (59.1 kg)   SpO2 99%   BMI 23.06 kg/m   Physical Exam   General appearance: alert, cooperative and appears stated age Ears: normal TM's and external ear canals both ears Throat: lips, mucosa, and tongue normal; teeth and gums normal Neck: no adenopathy, no carotid bruit, supple, symmetrical, trachea midline and thyroid not enlarged, symmetric, no tenderness/mass/nodules Back: symmetric, no curvature. ROM normal. No CVA tenderness. Lungs: clear to auscultation bilaterally Heart: regular rate and rhythm, S1, S2 normal, no murmur, click, rub or gallop Abdomen: soft, non-tender;  bowel sounds normal; no masses,  no organomegaly Pulses: 2+ and symmetric Skin: Skin color, texture, turgor normal. No rashes or lesions Lymph nodes: Cervical, supraclavicular, and axillary nodes normal.    Assessment & Plan:   Problem List Items Addressed This Visit    Atypical ductal hyperplasia of left breast    With recurrent calcifications,  Now s/p nipple sparing mastectomy on the left (2019) and reconstruction done   Annual mammograms of right breast scheduled for Jan 20       B12 deficiency    managed with monthly IM injections by mother who is an Therapist, sports .  Intrinsic factor ab test was borderline positive      Encounter for preventive health examination - Primary    age appropriate education and counseling updated, referrals for preventative services and immunizations addressed, dietary and smoking counseling addressed, most recent labs reviewed.  I have personally reviewed and have noted:  1) the patient's medical and social history 2) The pt's use of alcohol, tobacco, and illicit drugs 3) The patient's current medications and supplements 4) Functional ability including ADL's, fall risk, home safety risk, hearing and visual impairment 5) Diet and physical activities 6) Evidence for depression or mood disorder 7) The patient's height, weight, and BMI have been recorded in the chart  I have made referrals, and provided counseling and education based on review of the above      Relevant Orders   Lipid panel (Completed)  Excess body and facial hair    She has no evidence of PCOS.  Testosterone and DHEA requested       Relevant Orders   Testos,Total,Free and SHBG (Female)   DHEA-sulfate   H/O mastectomy, left   History of augmentation of right breast    Doe by claire Dillingham 2019 to match the reconstruction of the left breast after nipple sparing mastectomy       Screening for cervical cancer    Other Visit Diagnoses    Fatigue, unspecified type       Relevant  Orders   Comprehensive metabolic panel (Completed)   TSH (Completed)   CBC with Differential/Platelet (Completed)   Preventive antibiotic          I have discontinued Anadalay L. Gilley's cyanocobalamin. I am also having her start on cyanocobalamin. Additionally, I am having her maintain her calcium citrate-vitamin D, Artificial Tear Solution (SYSTANE CONTACTS OP), and vitamin C.  Meds ordered this encounter  Medications  . cyanocobalamin (,VITAMIN B-12,) 1000 MCG/ML injection    Sig: Inject 1 mL (1,000 mcg total) into the muscle every 30 (thirty) days.    Dispense:  10 mL    Refill:  1    Medications Discontinued During This Encounter  Medication Reason  . cyanocobalamin (,VITAMIN B-12,) 1000 MCG/ML injection     Follow-up: No follow-ups on file.   Crecencio Mc, MD

## 2018-03-17 NOTE — Patient Instructions (Signed)
GOOD TO SEE YOU!  I ORDERED LABS FOR YOUR CPE  IF THEY CANT'  SEE THEM TEXT M [id number] [phone]  Health Maintenance, Female Adopting a healthy lifestyle and getting preventive care can go a long way to promote health and wellness. Talk with your health care provider about what schedule of regular examinations is right for you. This is a good chance for you to check in with your provider about disease prevention and staying healthy. In between checkups, there are plenty of things you can do on your own. Experts have done a lot of research about which lifestyle changes and preventive measures are most likely to keep you healthy. Ask your health care provider for more information. Weight and diet Eat a healthy diet  Be sure to include plenty of vegetables, fruits, low-fat dairy products, and lean protein.  Do not eat a lot of foods high in solid fats, added sugars, or salt.  Get regular exercise. This is one of the most important things you can do for your health. ? Most adults should exercise for at least 150 minutes each week. The exercise should increase your heart rate and make you sweat (moderate-intensity exercise). ? Most adults should also do strengthening exercises at least twice a week. This is in addition to the moderate-intensity exercise. Maintain a healthy weight  Body mass index (BMI) is a measurement that can be used to identify possible weight problems. It estimates body fat based on height and weight. Your health care provider can help determine your BMI and help you achieve or maintain a healthy weight.  For females 23 years of age and older: ? A BMI below 18.5 is considered underweight. ? A BMI of 18.5 to 24.9 is normal. ? A BMI of 25 to 29.9 is considered overweight. ? A BMI of 30 and above is considered obese. Watch levels of cholesterol and blood lipids  You should start having your blood tested for lipids and cholesterol at 45 years of age, then have this test every 5  years.  You may need to have your cholesterol levels checked more often if: ? Your lipid or cholesterol levels are high. ? You are older than 45 years of age. ? You are at high risk for heart disease. Cancer screening Lung Cancer  Lung cancer screening is recommended for adults 23-87 years old who are at high risk for lung cancer because of a history of smoking.  A yearly low-dose CT scan of the lungs is recommended for people who: ? Currently smoke. ? Have quit within the past 15 years. ? Have at least a 30-pack-year history of smoking. A pack year is smoking an average of one pack of cigarettes a day for 1 year.  Yearly screening should continue until it has been 15 years since you quit.  Yearly screening should stop if you develop a health problem that would prevent you from having lung cancer treatment. Breast Cancer  Practice breast self-awareness. This means understanding how your breasts normally appear and feel.  It also means doing regular breast self-exams. Let your health care provider know about any changes, no matter how small.  If you are in your 20s or 30s, you should have a clinical breast exam (CBE) by a health care provider every 1-3 years as part of a regular health exam.  If you are 38 or older, have a CBE every year. Also consider having a breast X-ray (mammogram) every year.  If you have a  family history of breast cancer, talk to your health care provider about genetic screening.  If you are at high risk for breast cancer, talk to your health care provider about having an MRI and a mammogram every year.  Breast cancer gene (BRCA) assessment is recommended for women who have family members with BRCA-related cancers. BRCA-related cancers include: ? Breast. ? Ovarian. ? Tubal. ? Peritoneal cancers.  Results of the assessment will determine the need for genetic counseling and BRCA1 and BRCA2 testing. Cervical Cancer Your health care provider may recommend  that you be screened regularly for cancer of the pelvic organs (ovaries, uterus, and vagina). This screening involves a pelvic examination, including checking for microscopic changes to the surface of your cervix (Pap test). You may be encouraged to have this screening done every 3 years, beginning at age 54.  For women ages 55-65, health care providers may recommend pelvic exams and Pap testing every 3 years, or they may recommend the Pap and pelvic exam, combined with testing for human papilloma virus (HPV), every 5 years. Some types of HPV increase your risk of cervical cancer. Testing for HPV may also be done on women of any age with unclear Pap test results.  Other health care providers may not recommend any screening for nonpregnant women who are considered low risk for pelvic cancer and who do not have symptoms. Ask your health care provider if a screening pelvic exam is right for you.  If you have had past treatment for cervical cancer or a condition that could lead to cancer, you need Pap tests and screening for cancer for at least 20 years after your treatment. If Pap tests have been discontinued, your risk factors (such as having a new sexual partner) need to be reassessed to determine if screening should resume. Some women have medical problems that increase the chance of getting cervical cancer. In these cases, your health care provider may recommend more frequent screening and Pap tests. Colorectal Cancer  This type of cancer can be detected and often prevented.  Routine colorectal cancer screening usually begins at 45 years of age and continues through 45 years of age.  Your health care provider may recommend screening at an earlier age if you have risk factors for colon cancer.  Your health care provider may also recommend using home test kits to check for hidden blood in the stool.  A small camera at the end of a tube can be used to examine your colon directly (sigmoidoscopy or  colonoscopy). This is done to check for the earliest forms of colorectal cancer.  Routine screening usually begins at age 68.  Direct examination of the colon should be repeated every 5-10 years through 45 years of age. However, you may need to be screened more often if early forms of precancerous polyps or small growths are found. Skin Cancer  Check your skin from head to toe regularly.  Tell your health care provider about any new moles or changes in moles, especially if there is a change in a mole's shape or color.  Also tell your health care provider if you have a mole that is larger than the size of a pencil eraser.  Always use sunscreen. Apply sunscreen liberally and repeatedly throughout the day.  Protect yourself by wearing long sleeves, pants, a wide-brimmed hat, and sunglasses whenever you are outside. Heart disease, diabetes, and high blood pressure  High blood pressure causes heart disease and increases the risk of stroke. High blood pressure  is more likely to develop in: ? People who have blood pressure in the high end of the normal range (130-139/85-89 mm Hg). ? People who are overweight or obese. ? People who are African American.  If you are 72-32 years of age, have your blood pressure checked every 3-5 years. If you are 15 years of age or older, have your blood pressure checked every year. You should have your blood pressure measured twice-once when you are at a hospital or clinic, and once when you are not at a hospital or clinic. Record the average of the two measurements. To check your blood pressure when you are not at a hospital or clinic, you can use: ? An automated blood pressure machine at a pharmacy. ? A home blood pressure monitor.  If you are between 34 years and 51 years old, ask your health care provider if you should take aspirin to prevent strokes.  Have regular diabetes screenings. This involves taking a blood sample to check your fasting blood sugar  level. ? If you are at a normal weight and have a low risk for diabetes, have this test once every three years after 45 years of age. ? If you are overweight and have a high risk for diabetes, consider being tested at a younger age or more often. Preventing infection Hepatitis B  If you have a higher risk for hepatitis B, you should be screened for this virus. You are considered at high risk for hepatitis B if: ? You were born in a country where hepatitis B is common. Ask your health care provider which countries are considered high risk. ? Your parents were born in a high-risk country, and you have not been immunized against hepatitis B (hepatitis B vaccine). ? You have HIV or AIDS. ? You use needles to inject street drugs. ? You live with someone who has hepatitis B. ? You have had sex with someone who has hepatitis B. ? You get hemodialysis treatment. ? You take certain medicines for conditions, including cancer, organ transplantation, and autoimmune conditions. Hepatitis C  Blood testing is recommended for: ? Everyone born from [birth year] through 1967. ? Anyone with known risk factors for hepatitis C. Sexually transmitted infections (STIs)  You should be screened for sexually transmitted infections (STIs) including gonorrhea and chlamydia if: ? You are sexually active and are younger than 45 years of age. ? You are older than 45 years of age and your health care provider tells you that you are at risk for this type of infection. ? Your sexual activity has changed since you were last screened and you are at an increased risk for chlamydia or gonorrhea. Ask your health care provider if you are at risk.  If you do not have HIV, but are at risk, it may be recommended that you take a prescription medicine daily to prevent HIV infection. This is called pre-exposure prophylaxis (PrEP). You are considered at risk if: ? You are sexually active and do not regularly use condoms or know the HIV status  of your partner(s). ? You take drugs by injection. ? You are sexually active with a partner who has HIV. Talk with your health care provider about whether you are at high risk of being infected with HIV. If you choose to begin PrEP, you should first be tested for HIV. You should then be tested every 3 months for as long as you are taking PrEP. Pregnancy  If you are premenopausal and you may become  pregnant, ask your health care provider about preconception counseling.  If you may become pregnant, take 400 to 800 micrograms (mcg) of folic acid every day.  If you want to prevent pregnancy, talk to your health care provider about birth control (contraception). Osteoporosis and menopause  Osteoporosis is a disease in which the bones lose minerals and strength with aging. This can result in serious bone fractures. Your risk for osteoporosis can be identified using a bone density scan.  If you are 40 years of age or older, or if you are at risk for osteoporosis and fractures, ask your health care provider if you should be screened.  Ask your health care provider whether you should take a calcium or vitamin D supplement to lower your risk for osteoporosis.  Menopause may have certain physical symptoms and risks.  Hormone replacement therapy may reduce some of these symptoms and risks. Talk to your health care provider about whether hormone replacement therapy is right for you. Follow these instructions at home:  Schedule regular health, dental, and eye exams.  Stay current with your immunizations.  Do not use any tobacco products including cigarettes, chewing tobacco, or electronic cigarettes.  If you are pregnant, do not drink alcohol.  If you are breastfeeding, limit how much and how often you drink alcohol.  Limit alcohol intake to no more than 1 drink per day for nonpregnant women. One drink equals 12 ounces of beer, 5 ounces of wine, or 1 ounces of hard liquor.  Do not use street  drugs.  Do not share needles.  Ask your health care provider for help if you need support or information about quitting drugs.  Tell your health care provider if you often feel depressed.  Tell your health care provider if you have ever been abused or do not feel safe at home. This information is not intended to replace advice given to you by your health care provider. Make sure you discuss any questions you have with your health care provider. Document Released: 09/01/2010 Document Revised: 07/25/2015 Document Reviewed: 11/20/2014 Elsevier Interactive Patient Education  2019 [employer].

## 2018-03-17 NOTE — Telephone Encounter (Signed)
Copied from [place] [id number]. 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  She stated one of the labs that was [facility] and needs to be [facility], it was the [place] (FEMALE).  Need to be under [facility] request and not [facility]   Fax it to [phone]

## 2018-03-18 LAB — CBC WITH DIFFERENTIAL/PLATELET
BASOS ABS: 0 10*3/uL (ref 0.0–0.2)
Basos: 1 %
EOS (ABSOLUTE): 0 10*3/uL (ref 0.0–0.4)
Eos: 1 %
HEMOGLOBIN: 12.4 g/dL (ref 11.1–15.9)
Hematocrit: 38.8 % (ref 34.0–46.6)
Immature Grans (Abs): 0 10*3/uL (ref 0.0–0.1)
Immature Granulocytes: 0 %
LYMPHS ABS: 1.1 10*3/uL (ref 0.7–3.1)
Lymphs: 24 %
MCH: 26.1 pg — AB (ref 26.6–33.0)
MCHC: 32 g/dL (ref 31.5–35.7)
MCV: 82 fL (ref 79–97)
MONOCYTES: 7 %
Monocytes Absolute: 0.3 10*3/uL (ref 0.1–0.9)
NEUTROS ABS: 2.9 10*3/uL (ref 1.4–7.0)
Neutrophils: 67 %
Platelets: 220 10*3/uL (ref 150–450)
RBC: 4.75 x10E6/uL (ref 3.77–5.28)
RDW: 12.4 % (ref 11.7–15.4)
WBC: 4.4 10*3/uL (ref 3.4–10.8)

## 2018-03-18 LAB — COMPREHENSIVE METABOLIC PANEL
A/G RATIO: 2.2 (ref 1.2–2.2)
ALBUMIN: 4.4 g/dL (ref 3.5–5.5)
ALT: 15 IU/L (ref 0–32)
AST: 18 IU/L (ref 0–40)
Alkaline Phosphatase: 43 IU/L (ref 39–117)
BILIRUBIN TOTAL: 0.5 mg/dL (ref 0.0–1.2)
BUN / CREAT RATIO: 11 (ref 9–23)
BUN: 7 mg/dL (ref 6–24)
CHLORIDE: 103 mmol/L (ref 96–106)
CO2: 24 mmol/L (ref 20–29)
Calcium: 9.3 mg/dL (ref 8.7–10.2)
Creatinine, Ser: 0.66 mg/dL (ref 0.57–1.00)
GFR calc Af Amer: 124 mL/min/{1.73_m2} (ref >59)
GFR calc non Af Amer: 108 mL/min/{1.73_m2} (ref >59)
GLOBULIN, TOTAL: 2 g/dL (ref 1.5–4.5)
Glucose: 82 mg/dL (ref 65–99)
POTASSIUM: 3.8 mmol/L (ref 3.5–5.2)
SODIUM: 142 mmol/L (ref 134–144)
Total Protein: 6.4 g/dL (ref 6.0–8.5)

## 2018-03-18 LAB — LIPID PANEL
CHOLESTEROL TOTAL: 152 mg/dL (ref 100–199)
Chol/HDL Ratio: 2.1 ratio (ref 0.0–4.4)
HDL: 73 mg/dL (ref >39)
LDL CALC: 73 mg/dL (ref 0–99)
TRIGLYCERIDES: 31 mg/dL (ref 0–149)
VLDL CHOLESTEROL CAL: 6 mg/dL (ref 5–40)

## 2018-03-18 LAB — TSH: TSH: 0.908 u[IU]/mL (ref 0.450–4.500)

## 2018-03-19 DIAGNOSIS — Z9889 Other specified postprocedural states: Secondary | ICD-10-CM | POA: Insufficient documentation

## 2018-03-19 DIAGNOSIS — L689 Hypertrichosis, unspecified: Secondary | ICD-10-CM | POA: Insufficient documentation

## 2018-03-19 DIAGNOSIS — Z9012 Acquired absence of left breast and nipple: Secondary | ICD-10-CM | POA: Insufficient documentation

## 2018-03-19 NOTE — Assessment & Plan Note (Signed)
With recurrent calcifications,  Now s/p nipple sparing mastectomy on the left (2019) and reconstruction done   Annual mammograms of right breast scheduled for Jan 20

## 2018-03-19 NOTE — Assessment & Plan Note (Signed)
Doe by Audelia Hives 2019 to match the reconstruction of the left breast after nipple sparing mastectomy

## 2018-03-19 NOTE — Assessment & Plan Note (Signed)
managed with monthly IM injections by mother who is an Therapist, sports .  Intrinsic factor ab test was borderline positive

## 2018-03-19 NOTE — Assessment & Plan Note (Signed)
She has no evidence of PCOS.  Testosterone and DHEA requested

## 2018-03-19 NOTE — Assessment & Plan Note (Signed)

## 2018-03-21 ENCOUNTER — Ambulatory Visit
Admission: RE | Admit: 2018-03-21 | Discharge: 2018-03-21 | Disposition: A | Discharge status: Home / Self Care | Payer: 59 | Source: Ambulatory Visit | Attending: Obstetrics & Gynecology | Admitting: Obstetrics & Gynecology

## 2018-03-21 DIAGNOSIS — Z6823 Body mass index (BMI) 23.0-23.9, adult: Secondary | ICD-10-CM | POA: Diagnosis not present

## 2018-03-21 DIAGNOSIS — Z01419 Encounter for gynecological examination (general) (routine) without abnormal findings: Secondary | ICD-10-CM | POA: Diagnosis not present

## 2018-03-21 DIAGNOSIS — Z1231 Encounter for screening mammogram for malignant neoplasm of breast: Secondary | ICD-10-CM

## 2018-03-21 HISTORY — DX: Malignant neoplasm of unspecified site of unspecified female breast: C50.919

## 2018-03-21 NOTE — Addendum Note (Signed)
Addended by: Adair Laundry on: 03/21/2018 09:38 AM   Modules accepted: Orders

## 2018-03-21 NOTE — Telephone Encounter (Signed)
Lab has been reordered and faxed to number provided below.

## 2018-03-23 ENCOUNTER — Encounter: Payer: Self-pay | Admitting: Physician Assistant

## 2018-03-24 ENCOUNTER — Telehealth: Payer: Self-pay

## 2018-03-24 DIAGNOSIS — L689 Hypertrichosis, unspecified: Secondary | ICD-10-CM

## 2018-03-24 NOTE — Telephone Encounter (Signed)
Orders have been reordered for pt to have done at [facility]. Pt is aware.

## 2018-03-24 NOTE — Addendum Note (Signed)
Addended by: Adair Laundry on: 03/24/2018 02:33 PM   Modules accepted: Orders

## 2018-03-24 NOTE — Telephone Encounter (Signed)
Copied from [place] [id number]. Topic: General - Other >> Mar 24, 2018  8:05 AM Yvette Rack wrote: Reason for CRM: pt calling wanting to know if she can get her future labs of DHEA and Test total set up for her to get it drawn at the hospital [facility]  pt works there and she can run upstairs to get it drawn pt would like for you to call her  at  work [phone]

## 2018-03-25 ENCOUNTER — Other Ambulatory Visit
Admission: RE | Admit: 2018-03-25 | Discharge: 2018-03-25 | Disposition: A | Discharge status: Home / Self Care | Payer: 59 | Source: Ambulatory Visit | Attending: Internal Medicine | Admitting: Internal Medicine

## 2018-03-25 DIAGNOSIS — L689 Hypertrichosis, unspecified: Secondary | ICD-10-CM | POA: Insufficient documentation

## 2018-03-26 LAB — SEX HORMONE BINDING GLOBULIN: Sex Hormone Binding: 84.8 nmol/L (ref 24.6–122.0)

## 2018-03-26 LAB — TESTOSTERONE: Testosterone: 15 ng/dL (ref 8–48)

## 2018-03-26 LAB — DHEA-SULFATE: DHEA-SO4: 36.1 ug/dL — ABNORMAL LOW (ref 57.3–279.2)

## 2018-03-26 LAB — TESTOSTERONE, FREE: Testosterone, Free: 1.3 pg/mL (ref 0.0–4.2)

## 2018-04-01 LAB — TESTOSTERONE, % FREE: Testosterone-% Free: 0.6 % (ref 0.2–0.7)

## 2018-04-26 ENCOUNTER — Institutional Professional Consult (permissible substitution): Payer: 59 | Admitting: Plastic Surgery

## 2018-04-28 DIAGNOSIS — L820 Inflamed seborrheic keratosis: Secondary | ICD-10-CM | POA: Diagnosis not present

## 2018-04-28 DIAGNOSIS — D229 Melanocytic nevi, unspecified: Secondary | ICD-10-CM | POA: Diagnosis not present

## 2018-05-10 ENCOUNTER — Encounter: Payer: Self-pay | Admitting: Plastic Surgery

## 2018-05-10 ENCOUNTER — Ambulatory Visit (INDEPENDENT_AMBULATORY_CARE_PROVIDER_SITE_OTHER): Payer: 59 | Admitting: Plastic Surgery

## 2018-05-10 VITALS — BP 124/78 | HR 75 | Temp 97.6°F | Ht 63.0 in | Wt 132.0 lb

## 2018-05-10 DIAGNOSIS — Z9012 Acquired absence of left breast and nipple: Secondary | ICD-10-CM

## 2018-05-10 DIAGNOSIS — Z9889 Other specified postprocedural states: Secondary | ICD-10-CM | POA: Insufficient documentation

## 2018-05-10 DIAGNOSIS — Z9882 Breast implant status: Secondary | ICD-10-CM | POA: Diagnosis not present

## 2018-05-10 DIAGNOSIS — N6092 Unspecified benign mammary dysplasia of left breast: Secondary | ICD-10-CM | POA: Diagnosis not present

## 2018-05-10 NOTE — Progress Notes (Addendum)
Patient ID: Robin Riley, female    DOB: 10-12-1975, 43 y.o.   MRN: [id number]   Chief Complaint  Patient presents with  . Breast Problem    ? assymetry- Right side    The patient is a 43 yrs old wf here for evaluation of her breast reconstruction. She underwent a partial mastectomy in 2017 for atypical ductal hyperplasia.  Her cancer risk was 40% at that time.  She did not have any radiation.  She decided to undergo a LEFT mastectomy and had reconstruction with a silicone implant on the left and an implant on the right for symmetry.  She is 5 feet 3 inches tall, weight is 132.  Her pre op bra was 34 A.   Left: Smooth Round High Profile Mentor 245 cc silicone implant, Right:  Smooth Round High Profile Mentor 809 cc silicone implant.  She is pleased with her result.  She is concerned about a little more movement of the right implant.  She is just not sure if it is normal.  It does have some movement laterally but not beyond what I would consider normal.  There is no sign of capsular contraction on either breast.  No lumps, bumps or concerning areas.  No tenderness.  And no axillary lymphadenopathy.   Review of Systems  Constitutional: Negative.  Negative for activity change and appetite change.  HENT: Negative.  Negative for voice change.   Eyes: Negative.   Respiratory: Negative.   Cardiovascular: Negative.   Gastrointestinal: Negative.  Negative for abdominal pain.  Endocrine: Negative.   Genitourinary: Negative.   Musculoskeletal: Negative.   Neurological: Negative.  Negative for facial asymmetry.  Hematological: Negative.   Psychiatric/Behavioral: Negative.     Past Medical History:  Diagnosis Date  . Acne    managed by Nehemiah Massed  . Adrenal tumor 2008   found during workup for hypertension  . Breast cancer ([place])   . Cancer ([place])   . Chest pain 2008   negative myoview, attributed to costochondritis  . Gastritis Sept 2008   mild, EGD [facility]   . L4 vertebral  fracture ([facility])    remote    Past Surgical History:  Procedure Laterality Date  . AUGMENTATION MAMMAPLASTY    . BREAST LUMPECTOMY WITH RADIOACTIVE SEED LOCALIZATION Left 10/04/2014   Procedure: LEFT BREAST LUMPECTOMY WITH RADIOACTIVE SEED LOCALIZATION;  Surgeon: Autumn Messing III, MD;  Location: [place];  Service: General;  Laterality: Left;  . BREAST RECONSTRUCTION WITH PLACEMENT OF TISSUE EXPANDER AND FLEX HD (ACELLULAR HYDRATED DERMIS) Left 06/03/2017   Procedure: LEFT BREAST RECONSTRUCTION WITH PLACEMENT OF TISSUE EXPANDER AND FLEX HD (ACELLULAR HYDRATED DERMIS);  Surgeon: Wallace Going, DO;  Location: [facility];  Service: Plastics;  Laterality: Left;  . ENDOMETRIAL ABLATION  2012  . MASTECTOMY    . NIPPLE SPARING MASTECTOMY Left 06/03/2017   Procedure: LEFT BREAST NIPPLE SPARING MASTECTOMY;  Surgeon: Jovita Kussmaul, MD;  Location: [place] [facility];  Service: General;  Laterality: Left;  . PLACEMENT OF BREAST IMPLANTS Right 09/01/2017   Procedure: PLACEMENT OF BREAST IMPLANT FOR SYMMETRY;  Surgeon: Wallace Going, DO;  Location: [place];  Service: Plastics;  Laterality: Right;  . REMOVAL OF TISSUE EXPANDER AND PLACEMENT OF IMPLANT Left 09/01/2017   Procedure: REMOVAL OF LEFT TISSUE EXPANDER WITH PLACEMENT OF LEFT BREAST IMPLANT;  Surgeon: Wallace Going, DO;  Location: [place];  Service: Plastics;  Laterality: Left;  . TONSILLECTOMY  Current Outpatient Medications:  .  Artificial Tear Solution (SYSTANE CONTACTS OP), Place 2 drops into the left eye daily as needed (dry eye)., Disp: , Rfl:  .  calcium citrate-vitamin D 200-200 MG-UNIT TABS, Take 1 tablet by mouth daily., Disp: , Rfl:  .  cyanocobalamin (,VITAMIN B-12,) 1000 MCG/ML injection, Inject 1 mL (1,000 mcg total) into the muscle every 30 (thirty) days., Disp: 10 mL, Rfl: 1 .  vitamin C (ASCORBIC ACID) 500 MG tablet, Take 500 mg by mouth daily., Disp: , Rfl:    Objective:    Vitals:   05/10/18 0809  BP: 124/78  Pulse: 75  Temp: 97.6 F (36.4 C)  SpO2: 100%    Physical Exam Vitals signs and nursing note reviewed.  Constitutional:      Appearance: Normal appearance.  HENT:     Head: Normocephalic and atraumatic.     Right Ear: External ear normal.     Left Ear: External ear normal.     Nose: Nose normal.     Mouth/Throat:     Mouth: Mucous membranes are moist.  Neck:     Musculoskeletal: Normal range of motion.  Cardiovascular:     Rate and Rhythm: Normal rate.     Pulses: Normal pulses.  Pulmonary:     Effort: Pulmonary effort is normal.  Abdominal:     General: Abdomen is flat. There is no distension.     Tenderness: There is no abdominal tenderness.  Skin:    General: Skin is warm.     Capillary Refill: Capillary refill takes less than 2 seconds.  Neurological:     General: No focal deficit present.     Mental Status: She is alert.  Psychiatric:        Mood and Affect: Mood normal.        Thought Content: Thought content normal.     Assessment & Plan:  H/O mastectomy, left  Atypical ductal hyperplasia of left breast  History of reconstruction of left breast  Continue yearly exams, mammogram on right yearly and evaluation of left every other year with MRI or ultrasound.  Self exams are also important.   Follow-up in 1 year.  [place], DO

## 2018-07-12 DIAGNOSIS — H04122 Dry eye syndrome of left lacrimal gland: Secondary | ICD-10-CM | POA: Diagnosis not present

## 2018-07-12 DIAGNOSIS — H16302 Unspecified interstitial keratitis, left eye: Secondary | ICD-10-CM | POA: Diagnosis not present

## 2018-09-26 ENCOUNTER — Telehealth: Payer: Self-pay | Admitting: Internal Medicine

## 2018-09-26 NOTE — Telephone Encounter (Signed)
09/26/2018 contacted patient at 2:35pm at patients request via email.  Patient was inquiring about a concern of lab being denied from visit on 03/25/2018 for labs. I was able to follow-up with the coding department and findings were....  Lab orders diagnosis match with what was documented for the testosterone and DHEA on 1/24 this was done at outside lab (diagnosis was excessive hair).  Labs for the 16th were done at [facility] and look to also be coded correct according to documentation.   Thanks

## 2018-10-25 DIAGNOSIS — H04332 Acute lacrimal canaliculitis of left lacrimal passage: Secondary | ICD-10-CM | POA: Diagnosis not present

## 2018-11-01 DIAGNOSIS — H04332 Acute lacrimal canaliculitis of left lacrimal passage: Secondary | ICD-10-CM | POA: Diagnosis not present

## 2018-11-15 DIAGNOSIS — H02835 Dermatochalasis of left lower eyelid: Secondary | ICD-10-CM | POA: Diagnosis not present

## 2018-11-15 DIAGNOSIS — H04412 Chronic dacryocystitis of left lacrimal passage: Secondary | ICD-10-CM | POA: Diagnosis not present

## 2018-11-15 DIAGNOSIS — H04222 Epiphora due to insufficient drainage, left lacrimal gland: Secondary | ICD-10-CM | POA: Diagnosis not present

## 2018-11-15 DIAGNOSIS — H02832 Dermatochalasis of right lower eyelid: Secondary | ICD-10-CM | POA: Diagnosis not present

## 2018-11-15 DIAGNOSIS — H04552 Acquired stenosis of left nasolacrimal duct: Secondary | ICD-10-CM | POA: Diagnosis not present

## 2018-11-15 DIAGNOSIS — H02834 Dermatochalasis of left upper eyelid: Secondary | ICD-10-CM | POA: Diagnosis not present

## 2018-11-15 DIAGNOSIS — H02831 Dermatochalasis of right upper eyelid: Secondary | ICD-10-CM | POA: Diagnosis not present

## 2018-12-13 DIAGNOSIS — H04552 Acquired stenosis of left nasolacrimal duct: Secondary | ICD-10-CM | POA: Diagnosis not present

## 2018-12-13 DIAGNOSIS — H02831 Dermatochalasis of right upper eyelid: Secondary | ICD-10-CM | POA: Diagnosis not present

## 2018-12-13 DIAGNOSIS — H02832 Dermatochalasis of right lower eyelid: Secondary | ICD-10-CM | POA: Diagnosis not present

## 2018-12-13 DIAGNOSIS — H04412 Chronic dacryocystitis of left lacrimal passage: Secondary | ICD-10-CM | POA: Diagnosis not present

## 2018-12-13 DIAGNOSIS — H02835 Dermatochalasis of left lower eyelid: Secondary | ICD-10-CM | POA: Diagnosis not present

## 2018-12-13 DIAGNOSIS — H04222 Epiphora due to insufficient drainage, left lacrimal gland: Secondary | ICD-10-CM | POA: Diagnosis not present

## 2018-12-13 DIAGNOSIS — H02834 Dermatochalasis of left upper eyelid: Secondary | ICD-10-CM | POA: Diagnosis not present

## 2019-01-05 ENCOUNTER — Other Ambulatory Visit: Payer: Self-pay

## 2019-01-05 ENCOUNTER — Other Ambulatory Visit
Admission: RE | Admit: 2019-01-05 | Discharge: 2019-01-05 | Disposition: A | Discharge status: Home / Self Care | Payer: 59 | Source: Ambulatory Visit | Attending: Ophthalmology | Admitting: Ophthalmology

## 2019-01-05 DIAGNOSIS — Z20828 Contact with and (suspected) exposure to other viral communicable diseases: Secondary | ICD-10-CM | POA: Diagnosis not present

## 2019-01-05 DIAGNOSIS — Z01812 Encounter for preprocedural laboratory examination: Secondary | ICD-10-CM | POA: Insufficient documentation

## 2019-01-05 LAB — SARS CORONAVIRUS 2 (TAT 6-24 HRS): SARS Coronavirus 2: NEGATIVE

## 2019-01-09 ENCOUNTER — Other Ambulatory Visit: Payer: Self-pay | Admitting: Ophthalmology

## 2019-01-09 DIAGNOSIS — H04222 Epiphora due to insufficient drainage, left lacrimal gland: Secondary | ICD-10-CM | POA: Diagnosis not present

## 2019-01-09 DIAGNOSIS — H04552 Acquired stenosis of left nasolacrimal duct: Secondary | ICD-10-CM | POA: Diagnosis not present

## 2019-01-09 DIAGNOSIS — H04022 Chronic dacryoadenitis, left lacrimal gland: Secondary | ICD-10-CM | POA: Diagnosis not present

## 2019-01-09 DIAGNOSIS — H04412 Chronic dacryocystitis of left lacrimal passage: Secondary | ICD-10-CM | POA: Diagnosis not present

## 2019-01-25 DIAGNOSIS — H04222 Epiphora due to insufficient drainage, left lacrimal gland: Secondary | ICD-10-CM | POA: Diagnosis not present

## 2019-01-25 DIAGNOSIS — H04552 Acquired stenosis of left nasolacrimal duct: Secondary | ICD-10-CM | POA: Diagnosis not present

## 2019-02-27 DIAGNOSIS — H04552 Acquired stenosis of left nasolacrimal duct: Secondary | ICD-10-CM | POA: Diagnosis not present

## 2019-02-27 DIAGNOSIS — H04222 Epiphora due to insufficient drainage, left lacrimal gland: Secondary | ICD-10-CM | POA: Diagnosis not present

## 2019-03-13 ENCOUNTER — Other Ambulatory Visit: Payer: Self-pay | Admitting: Obstetrics & Gynecology

## 2019-03-13 DIAGNOSIS — Z1231 Encounter for screening mammogram for malignant neoplasm of breast: Secondary | ICD-10-CM

## 2019-03-20 ENCOUNTER — Encounter: Payer: 59 | Admitting: Internal Medicine

## 2019-03-23 ENCOUNTER — Ambulatory Visit (INDEPENDENT_AMBULATORY_CARE_PROVIDER_SITE_OTHER): Payer: 59 | Admitting: Internal Medicine

## 2019-03-23 ENCOUNTER — Other Ambulatory Visit: Payer: Self-pay

## 2019-03-23 ENCOUNTER — Telehealth: Payer: Self-pay | Admitting: Internal Medicine

## 2019-03-23 ENCOUNTER — Encounter: Payer: Self-pay | Admitting: Internal Medicine

## 2019-03-23 DIAGNOSIS — Z0000 Encounter for general adult medical examination without abnormal findings: Secondary | ICD-10-CM | POA: Diagnosis not present

## 2019-03-23 DIAGNOSIS — R634 Abnormal weight loss: Secondary | ICD-10-CM

## 2019-03-23 DIAGNOSIS — C50012 Malignant neoplasm of nipple and areola, left female breast: Secondary | ICD-10-CM

## 2019-03-23 DIAGNOSIS — N6092 Unspecified benign mammary dysplasia of left breast: Secondary | ICD-10-CM

## 2019-03-23 NOTE — Progress Notes (Signed)
Patient ID: Robin Riley, female    DOB: 1975-09-01  Age: 45 y.o. MRN: [id number]  The patient is here for annual preventive  examination .  This visit occurred during the SARS-CoV-2 public health emergency.  Safety protocols were in place, including screening questions prior to the visit, additional usage of staff PPE, and extensive cleaning of exam room while observing appropriate contact time as indicated for disinfecting solutions.   PAP smear due in 2022 History of left BRCA s/p mastectomy  , unilateral Mammogram up to date    The risk factors are reflected in the social history.  The roster of all physicians providing medical care to patient - is listed in the Snapshot section of the chart.  Activities of daily living:  The patient is 100% independent in all ADLs: dressing, toileting, feeding as well as independent mobility  Home safety : The patient has smoke detectors in the home. They wear seatbelts.  There are no firearms at home. There is no violence in the home.   There is no risks for hepatitis, STDs or HIV. There is no   history of blood transfusion. They have no travel history to infectious disease endemic areas of the world.  The patient has seen their dentist in the last six month. They have seen their eye doctor in the last year. .  They do not  have excessive sun exposure. Discussed the need for sun protection: hats, long sleeves and use of sunscreen if there is significant sun exposure.   Diet: the importance of a healthy diet is discussed. They do have a healthy diet.  The benefits of regular aerobic exercise were discussed. She exercises  4 times per week ,  60 minutes.   Depression screen: there are no signs or vegative symptoms of depression- irritability, change in appetite, anhedonia, sadness/tearfullness.  Cognitive assessment: the patient manages all their financial and personal affairs and is actively engaged. They could relate day,date,year and events;  recalled 2/3 objects at 3 minutes; performed clock-face test normally.  The following portions of the patient's history were reviewed and updated as appropriate: allergies, current medications, past family history, past medical history,  past surgical history, past social history  and problem list.  Visual acuity was not assessed per patient preference since she has regular follow up with her ophthalmologist. Hearing and body mass index were assessed and reviewed.   During the course of the visit the patient was educated and counseled about appropriate screening and preventive services including : fall prevention , diabetes screening, nutrition counseling, colorectal cancer screening, and recommended immunizations.    CC: Diagnoses of Encounter for preventive health examination, Malignant neoplasm of nipple of left breast in female, unspecified estrogen receptor status ([place]), Atypical ductal hyperplasia of left breast, and Weight loss were pertinent to this visit.  History Robin Riley has a past medical history of Acne, Adrenal tumor (2008), Breast cancer ([place]), Cancer [facility]), Chest pain (2008), Gastritis (Sept 2008), History of dysplastic nevus, and L4 vertebral fracture ([place]).   She has a past surgical history that includes Endometrial ablation (2012); Tonsillectomy; Breast lumpectomy with radioactive seed localization (Left, 10/04/2014); Nipple sparing mastectomy (Left, 06/03/2017); Breast reconstruction with placement of tissue expander and flex hd (acellular hydrated dermis) (Left, 06/03/2017); Mastectomy; Removal of tissue expander and placement of implant (Left, 09/01/2017); Placement of breast implants (Right, 09/01/2017); and Augmentation mammaplasty.   Her family history includes Cancer in her maternal aunt; Diabetes in her maternal grandmother; Heart disease in her maternal  grandfather; Hypertension in her mother; Osteoporosis in her maternal grandmother and mother.She reports that she has never smoked.  She has never used smokeless tobacco. She reports that she does not drink alcohol or use drugs.  Outpatient Medications Prior to Visit  Medication Sig Dispense Refill  . calcium citrate-vitamin D 200-200 MG-UNIT TABS Take 1 tablet by mouth daily.    . cyanocobalamin (,VITAMIN B-12,) 1000 MCG/ML injection Inject 1 mL (1,000 mcg total) into the muscle every 30 (thirty) days. 10 mL 1  . vitamin C (ASCORBIC ACID) 500 MG tablet Take 500 mg by mouth daily.    . Artificial Tear Solution (SYSTANE CONTACTS OP) Place 2 drops into the left eye daily as needed (dry eye).     No facility-administered medications prior to visit.    Review of Systems   Patient denies headache, fevers, malaise, unintentional weight loss, skin rash, eye pain, sinus congestion and sinus pain, sore throat, dysphagia,  hemoptysis , cough, dyspnea, wheezing, chest pain, palpitations, orthopnea, edema, abdominal pain, nausea, melena, diarrhea, constipation, flank pain, dysuria, hematuria, urinary  Frequency, nocturia, numbness, tingling, seizures,  Focal weakness, Loss of consciousness,  Tremor, insomnia, depression, anxiety, and suicidal ideation.      Objective:  BP 110/70   Pulse 81   Ht _0  (1.6 m)   Wt 125 lb 12.8 oz (57.1 kg)   BMI 22.28 kg/m   Physical Exam   General appearance: alert, cooperative and appears stated age Head: Normocephalic, without obvious abnormality, atraumatic Eyes: conjunctivae/corneas clear. PERRL, EOM's intact. Fundi benign. Ears: normal TM's and external ear canals both ears Nose: Nares normal. Septum midline. Mucosa normal. No drainage or sinus tenderness. Throat: lips, mucosa, and tongue normal; teeth and gums normal Neck: no adenopathy, no carotid bruit, no JVD, supple, symmetrical, trachea midline and thyroid not enlarged, symmetric, no tenderness/mass/nodules Lungs: clear to auscultation bilaterally Breasts: bilateral reconstruction with implants. normal appearance, no masses or  tenderness Heart: regular rate and rhythm, S1, S2 normal, no murmur, click, rub or gallop Abdomen: soft, non-tender; bowel sounds normal; no masses,  no organomegaly Extremities: extremities normal, atraumatic, no cyanosis or edema Pulses: 2+ and symmetric Skin: Skin color, texture, turgor normal. No rashes or lesions Neurologic: Alert and oriented X 3, normal strength and tone. Normal symmetric reflexes. Normal coordination and gait.      Assessment & Plan:   Problem List Items Addressed This Visit      Unprioritized   Atypical ductal hyperplasia of left breast    S/p left mastectomy for increased risk of cancer.   Right breast mammogram normal Jan 2020 and scheduled for Feb 2021      RESOLVED: Breast cancer [facility])    With recurrent calcifications,  Now s/p nipple sparing mastectomy on the left (2019) and reconstruction done   Annual mammograms of right breast was normal  Jan 20       Encounter for preventive health examination    age appropriate education and counseling updated, referrals for preventative services and immunizations addressed, dietary and smoking counseling addressed, most recent labs reviewed.  I have personally reviewed and have noted:  1) the patient's medical and social history 2) The pt's use of alcohol, tobacco, and illicit drugs 3) The patient's current medications and supplements 4) Functional ability including ADL's, fall risk, home safety risk, hearing and visual impairment 5) Diet and physical activities 6) Evidence for depression or mood disorder 7) The patient's height, weight, and BMI have been recorded in the chart  I have made referrals, and provided counseling and education based on review of the above      Weight loss    Intentional,  With low glycemic index diet.          I have discontinued Emil L. Gilley's Artificial Tear Solution (SYSTANE CONTACTS OP). I am also having her maintain her calcium citrate-vitamin D, vitamin C, and  cyanocobalamin.  No orders of the defined types were placed in this encounter.   Medications Discontinued During This Encounter  Medication Reason  . Artificial Tear Solution (SYSTANE CONTACTS OP) No longer needed (for PRN medications)    Follow-up: No follow-ups on file.   Crecencio Mc, MD

## 2019-03-23 NOTE — Assessment & Plan Note (Signed)

## 2019-03-23 NOTE — Patient Instructions (Signed)
I   DO recommend the COVID 19 vaccine for you.  I also recommend the following  Supplements during the epidemic  Vit D3 2000 IUs daily  Zinc 100 mg daily  Vitamin C 500 mg daily    No labs needed this year   Check with your insurance about the cologuard test as a covered screening test for colon cancer.  Screening is now recommended starting at age 46   Health Maintenance, Female Adopting a healthy lifestyle and getting preventive care are important in promoting health and wellness. Ask your health care provider about:  The right schedule for you to have regular tests and exams.  Things you can do on your own to prevent diseases and keep yourself healthy. What should I know about diet, weight, and exercise? Eat a healthy diet   Eat a diet that includes plenty of vegetables, fruits, low-fat dairy products, and lean protein.  Do not eat a lot of foods that are high in solid fats, added sugars, or sodium. Maintain a healthy weight Body mass index (BMI) is used to identify weight problems. It estimates body fat based on height and weight. Your health care provider can help determine your BMI and help you achieve or maintain a healthy weight. Get regular exercise Get regular exercise. This is one of the most important things you can do for your health. Most adults should:  Exercise for at least 150 minutes each week. The exercise should increase your heart rate and make you sweat (moderate-intensity exercise).  Do strengthening exercises at least twice a week. This is in addition to the moderate-intensity exercise.  Spend less time sitting. Even light physical activity can be beneficial. Watch cholesterol and blood lipids Have your blood tested for lipids and cholesterol at 46 years of age, then have this test every 5 years. Have your cholesterol levels checked more often if:  Your lipid or cholesterol levels are high.  You are older than 46 years of age.  You are at high  risk for heart disease. What should I know about cancer screening? Depending on your health history and family history, you may need to have cancer screening at various ages. This may include screening for:  Breast cancer.  Cervical cancer.  Colorectal cancer.  Skin cancer.  Lung cancer. What should I know about heart disease, diabetes, and high blood pressure? Blood pressure and heart disease  High blood pressure causes heart disease and increases the risk of stroke. This is more likely to develop in people who have high blood pressure readings, are of African descent, or are overweight.  Have your blood pressure checked: ? Every 3-5 years if you are 81-40 years of age. ? Every year if you are 38 years old or older. Diabetes Have regular diabetes screenings. This checks your fasting blood sugar level. Have the screening done:  Once every three years after age 77 if you are at a normal weight and have a low risk for diabetes.  More often and at a younger age if you are overweight or have a high risk for diabetes. What should I know about preventing infection? Hepatitis B If you have a higher risk for hepatitis B, you should be screened for this virus. Talk with your health care provider to find out if you are at risk for hepatitis B infection. Hepatitis C Testing is recommended for:  Everyone born from 1 through 1967.  Anyone with known risk factors for hepatitis C. Sexually transmitted infections (STIs)  Get screened for STIs, including gonorrhea and chlamydia, if: ? You are sexually active and are younger than 46 years of age. ? You are older than 46 years of age and your health care provider tells you that you are at risk for this type of infection. ? Your sexual activity has changed since you were last screened, and you are at increased risk for chlamydia or gonorrhea. Ask your health care provider if you are at risk.  Ask your health care provider about whether you  are at high risk for HIV. Your health care provider may recommend a prescription medicine to help prevent HIV infection. If you choose to take medicine to prevent HIV, you should first get tested for HIV. You should then be tested every 3 months for as long as you are taking the medicine. Pregnancy  If you are about to stop having your period (premenopausal) and you may become pregnant, seek counseling before you get pregnant.  Take 400 to 800 micrograms (mcg) of folic acid every day if you become pregnant.  Ask for birth control (contraception) if you want to prevent pregnancy. Osteoporosis and menopause Osteoporosis is a disease in which the bones lose minerals and strength with aging. This can result in bone fractures. If you are 39 years old or older, or if you are at risk for osteoporosis and fractures, ask your health care provider if you should:  Be screened for bone loss.  Take a calcium or vitamin D supplement to lower your risk of fractures.  Be given hormone replacement therapy (HRT) to treat symptoms of menopause. Follow these instructions at home: Lifestyle  Do not use any products that contain nicotine or tobacco, such as cigarettes, e-cigarettes, and chewing tobacco. If you need help quitting, ask your health care provider.  Do not use street drugs.  Do not share needles.  Ask your health care provider for help if you need support or information about quitting drugs. Alcohol use  Do not drink alcohol if: ? Your health care provider tells you not to drink. ? You are pregnant, may be pregnant, or are planning to become pregnant.  If you drink alcohol: ? Limit how much you use to 0-1 drink a day. ? Limit intake if you are breastfeeding.  Be aware of how much alcohol is in your drink. In the [place] one drink equals one 12 oz bottle of beer (355 mL), one 5 oz glass of wine (148 mL), or one 1 oz glass of hard liquor (44 mL). General instructions  Schedule regular  health, dental, and eye exams.  Stay current with your vaccines.  Tell your health care provider if: ? You often feel depressed. ? You have ever been abused or do not feel safe at home. Summary  Adopting a healthy lifestyle and getting preventive care are important in promoting health and wellness.  Follow your health care provider's instructions about healthy diet, exercising, and getting tested or screened for diseases.  Follow your health care provider's instructions on monitoring your cholesterol and blood pressure. This information is not intended to replace advice given to you by your health care provider. Make sure you discuss any questions you have with your health care provider. Document Revised: 02/09/2018 Document Reviewed: 02/09/2018 Elsevier Patient Education  2020 [employer].

## 2019-03-23 NOTE — Telephone Encounter (Signed)
Yes, that's fine.  I DO NOT RECOMMEND INTRANASAL ZINC , only oral.  THE NASAL SPRAYS HAVE BEEN ASSOCIATED WITH PERMENENT LOSS OF SMELL (THE PRODUCT ZICAM)

## 2019-03-23 NOTE — Telephone Encounter (Signed)
Pt called wanting to know if she could take the  3X-15mg  of high potency Zinc instead of 100mg  zinc because this is what she could find

## 2019-03-24 NOTE — Telephone Encounter (Signed)
LM advising of Dr. Lupita Dawn message from below. I asked if patient had any questions she could call back our office.

## 2019-03-26 DIAGNOSIS — R634 Abnormal weight loss: Secondary | ICD-10-CM | POA: Insufficient documentation

## 2019-03-26 NOTE — Assessment & Plan Note (Signed)
Intentional,  With low glycemic index diet.

## 2019-03-26 NOTE — Assessment & Plan Note (Signed)
With recurrent calcifications,  Now s/p nipple sparing mastectomy on the left (2019) and reconstruction done   Annual mammograms of right breast was normal  Jan 20

## 2019-03-26 NOTE — Assessment & Plan Note (Addendum)
S/p left mastectomy for increased risk of cancer.   Right breast mammogram normal Jan 2020 and scheduled for Feb 2021

## 2019-03-28 DIAGNOSIS — Z01419 Encounter for gynecological examination (general) (routine) without abnormal findings: Secondary | ICD-10-CM | POA: Diagnosis not present

## 2019-03-28 DIAGNOSIS — Z6822 Body mass index (BMI) 22.0-22.9, adult: Secondary | ICD-10-CM | POA: Diagnosis not present

## 2019-03-29 DIAGNOSIS — Z01419 Encounter for gynecological examination (general) (routine) without abnormal findings: Secondary | ICD-10-CM | POA: Diagnosis not present

## 2019-04-13 ENCOUNTER — Ambulatory Visit
Admission: RE | Admit: 2019-04-13 | Discharge: 2019-04-13 | Disposition: A | Discharge status: Home / Self Care | Payer: 59 | Source: Ambulatory Visit | Attending: Obstetrics & Gynecology | Admitting: Obstetrics & Gynecology

## 2019-04-13 ENCOUNTER — Other Ambulatory Visit: Payer: Self-pay

## 2019-04-13 DIAGNOSIS — Z1231 Encounter for screening mammogram for malignant neoplasm of breast: Secondary | ICD-10-CM

## 2019-05-12 ENCOUNTER — Ambulatory Visit: Payer: 59 | Admitting: Plastic Surgery

## 2019-06-22 MED ORDER — CIPROFLOXACIN HCL 250 MG PO TABS: 250.0000 mg | ORAL_TABLET | Freq: Two times a day (BID) | ORAL | 0 refills | Status: DC

## 2019-06-22 MED ORDER — FLUCONAZOLE 150 MG PO TABS: 150.0000 mg | ORAL_TABLET | Freq: Every day | ORAL | 0 refills | Status: DC

## 2019-06-22 NOTE — Telephone Encounter (Signed)
Please advise-no appts avail today. Pt would like antibiotics and Diflucan. Please call work number @ [phone]. Pharmacy is [facility] employee pharm

## 2019-07-28 ENCOUNTER — Telehealth: Payer: 59 | Admitting: Nurse Practitioner

## 2019-07-28 ENCOUNTER — Ambulatory Visit: Payer: 59 | Admitting: Plastic Surgery

## 2019-07-28 DIAGNOSIS — B379 Candidiasis, unspecified: Secondary | ICD-10-CM | POA: Diagnosis not present

## 2019-07-28 MED ORDER — FLUCONAZOLE 150 MG PO TABS: 150.0000 mg | ORAL_TABLET | Freq: Once | ORAL | 0 refills | Status: DC

## 2019-07-28 NOTE — Progress Notes (Signed)
We are sorry that you are not feeling well. Here is how we plan to help! Based on what you shared with me it looks like you: May have a yeast vaginosis  Vaginosis is an inflammation of the vagina that can result in discharge, itching and pain. The cause is usually a change in the normal balance of vaginal bacteria or an infection. Vaginosis can also result from reduced estrogen levels after menopause.  The most common causes of vaginosis are:   Bacterial vaginosis which results from an overgrowth of one on several organisms that are normally present in your vagina.   Yeast infections which are caused by a naturally occurring fungus called candida.   Vaginal atrophy (atrophic vaginosis) which results from the thinning of the vagina from reduced estrogen levels after menopause.   Trichomoniasis which is caused by a parasite and is commonly transmitted by sexual intercourse.  Factors that increase your risk of developing vaginosis include: . Medications, such as antibiotics and steroids . Uncontrolled diabetes . Use of hygiene products such as bubble bath, vaginal spray or vaginal deodorant . Douching . Wearing damp or tight-fitting clothing . Using an intrauterine device (IUD) for birth control . Hormonal changes, such as those associated with pregnancy, birth control pills or menopause . Sexual activity . Having a sexually transmitted infection  Your treatment plan is A single Diflucan (fluconazole) 150mg tablet once.  I have electronically sent this prescription into the pharmacy that you have chosen.  Be sure to take all of the medication as directed. Stop taking any medication if you develop a rash, tongue swelling or shortness of breath. Mothers who are breast feeding should consider pumping and discarding their breast milk while on these antibiotics. However, there is no consensus that infant exposure at these doses would be harmful.  Remember that medication creams can weaken latex  condoms. .   HOME CARE:  Good hygiene may prevent some types of vaginosis from recurring and may relieve some symptoms:  . Avoid baths, hot tubs and whirlpool spas. Rinse soap from your outer genital area after a shower, and dry the area well to prevent irritation. Don't use scented or harsh soaps, such as those with deodorant or antibacterial action. . Avoid irritants. These include scented tampons and pads. . Wipe from front to back after using the toilet. Doing so avoids spreading fecal bacteria to your vagina.  Other things that may help prevent vaginosis include:  . Don't douche. Your vagina doesn't require cleansing other than normal bathing. Repetitive douching disrupts the normal organisms that reside in the vagina and can actually increase your risk of vaginal infection. Douching won't clear up a vaginal infection. . Use a latex condom. Both female and female latex condoms may help you avoid infections spread by sexual contact. . Wear cotton underwear. Also wear pantyhose with a cotton crotch. If you feel comfortable without it, skip wearing underwear to bed. Yeast thrives in moist environments Your symptoms should improve in the next day or two.  GET HELP RIGHT AWAY IF:  . You have pain in your lower abdomen ( pelvic area or over your ovaries) . You develop nausea or vomiting . You develop a fever . Your discharge changes or worsens . You have persistent pain with intercourse . You develop shortness of breath, a rapid pulse, or you faint.  These symptoms could be signs of problems or infections that need to be evaluated by a medical provider now.  MAKE SURE YOU      Understand these instructions.  Will watch your condition.  Will get help right away if you are not doing well or get worse.  Your e-visit answers were reviewed by a board certified advanced clinical practitioner to complete your personal care plan. Depending upon the condition, your plan could have included  both over the counter or prescription medications. Please review your pharmacy choice to make sure that you have choses a pharmacy that is open for you to pick up any needed prescription, Your safety is important to us. If you have drug allergies check your prescription carefully.   You can use MyChart to ask questions about today's visit, request a non-urgent call back, or ask for a work or school excuse for 24 hours related to this e-Visit. If it has been greater than 24 hours you will need to follow up with your provider, or enter a new e-Visit to address those concerns. You will get a MyChart message within the next two days asking about your experience. I hope that your e-visit has been valuable and will speed your recovery.  I have spent at least 5 minutes reviewing and documenting in the patient's chart. 

## 2019-08-29 ENCOUNTER — Other Ambulatory Visit: Payer: Self-pay | Admitting: Internal Medicine

## 2019-09-29 DIAGNOSIS — N6092 Unspecified benign mammary dysplasia of left breast: Secondary | ICD-10-CM | POA: Diagnosis not present

## 2019-10-24 ENCOUNTER — Ambulatory Visit: Payer: 59 | Admitting: Plastic Surgery

## 2019-10-31 MED ORDER — CYANOCOBALAMIN 1000 MCG/ML IJ SOLN: 1000.0000 ug | INTRAMUSCULAR | 1 refills | Status: DC

## 2019-11-03 ENCOUNTER — Ambulatory Visit (INDEPENDENT_AMBULATORY_CARE_PROVIDER_SITE_OTHER): Payer: 59 | Admitting: Plastic Surgery

## 2019-11-03 ENCOUNTER — Other Ambulatory Visit: Payer: Self-pay

## 2019-11-03 ENCOUNTER — Encounter: Payer: Self-pay | Admitting: Plastic Surgery

## 2019-11-03 VITALS — BP 122/79 | HR 71 | Temp 97.9°F

## 2019-11-03 DIAGNOSIS — Z9889 Other specified postprocedural states: Secondary | ICD-10-CM | POA: Diagnosis not present

## 2019-11-03 DIAGNOSIS — Z9012 Acquired absence of left breast and nipple: Secondary | ICD-10-CM

## 2019-11-03 DIAGNOSIS — N6092 Unspecified benign mammary dysplasia of left breast: Secondary | ICD-10-CM

## 2019-11-03 NOTE — Progress Notes (Signed)
Patient ID: Robin Riley, female    DOB: [birth year], 44 y.o.   MRN: [id number]   Chief Complaint  Patient presents with  . Follow-up    The patient is a 44 year old female here for her yearly follow-up on her breast reconstruction.  She had a left partial mastectomy for atypical ductal hyperplasia in 2017.  She did not have any radiation at that time.  She decided on a complete left mastectomy last year.  She underwent the complete mastectomy and had a silicone implant placed on the left.  She also had an implant placed on the right for symmetry.  She is 5 feet 3 inches tall.  She is still working at [employer] in [place].  She has a Product manager smooth round high-profile 325 cc implant on the right and a [place] round high profile silicone 595 cc implant on the left.  She has very good symmetry.  There is no sign of capsular contracture, lumps, masses or concerning areas.  No axillary lymphadenopathy is palpated and she does not have any tenderness.  The implants appear to be intact.   Review of Systems  Constitutional: Negative.  Negative for activity change and appetite change.  Eyes: Negative.   Respiratory: Negative.  Negative for chest tightness.   Cardiovascular: Negative.   Gastrointestinal: Negative.   Genitourinary: Negative.   Musculoskeletal: Negative.   Psychiatric/Behavioral: Negative.     Past Medical History:  Diagnosis Date  . Acne    managed by Nehemiah Massed  . Adrenal tumor 2008   found during workup for hypertension  . Breast cancer ([place])   . Cancer ([place])   . Chest pain 2008   negative myoview, attributed to costochondritis  . Gastritis Sept 2008   mild, EGD [facility]re[facility]   . History of dysplastic nevus   . L4 vertebral fracture ([facility])    remote    Past Surgical History:  Procedure Laterality Date  . AUGMENTATION MAMMAPLASTY    . BREAST LUMPECTOMY WITH RADIOACTIVE SEED LOCALIZATION Left 10/04/2014   Procedure: LEFT BREAST LUMPECTOMY WITH RADIOACTIVE SEED  LOCALIZATION;  Surgeon: Autumn Messing III, MD;  Location: [facility];  Service: General;  Laterality: Left;  . BREAST RECONSTRUCTION WITH PLACEMENT OF TISSUE EXPANDER AND FLEX HD (ACELLULAR HYDRATED DERMIS) Left 06/03/2017   Procedure: LEFT BREAST RECONSTRUCTION WITH PLACEMENT OF TISSUE EXPANDER AND FLEX HD (ACELLULAR HYDRATED DERMIS);  Surgeon: Wallace Going, DO;  Location: [facility];  Service: Plastics;  Laterality: Left;  . ENDOMETRIAL ABLATION  2012  . MASTECTOMY    . NIPPLE SPARING MASTECTOMY Left 06/03/2017   Procedure: LEFT BREAST NIPPLE SPARING MASTECTOMY;  Surgeon: Jovita Kussmaul, MD;  Location: [facility];  Service: General;  Laterality: Left;  . PLACEMENT OF BREAST IMPLANTS Right 09/01/2017   Procedure: PLACEMENT OF BREAST IMPLANT FOR SYMMETRY;  Surgeon: Wallace Going, DO;  Location: [facility];  Service: Plastics;  Laterality: Right;  . REMOVAL OF TISSUE EXPANDER AND PLACEMENT OF IMPLANT Left 09/01/2017   Procedure: REMOVAL OF LEFT TISSUE EXPANDER WITH PLACEMENT OF LEFT BREAST IMPLANT;  Surgeon: Wallace Going, DO;  Location: [facility];  Service: Plastics;  Laterality: Left;  . TONSILLECTOMY        Current Outpatient Medications:  .  calcium citrate-vitamin D 200-200 MG-UNIT TABS, Take 1 tablet by mouth daily., Disp: , Rfl:  .  cyanocobalamin (,VITAMIN B-12,) 1000 MCG/ML injection, Inject 1 mL (1,000 mcg total) into the muscle every 30 (  thirty) days., Disp: 10 mL, Rfl: 1 .  vitamin C (ASCORBIC ACID) 500 MG tablet, Take 500 mg by mouth daily., Disp: , Rfl:    Objective:   Vitals:   11/03/19 0812  BP: 122/79  Pulse: 71  Temp: 97.9 F (36.6 C)  SpO2: 99%    Physical Exam Vitals and nursing note reviewed.  Constitutional:      Appearance: Normal appearance.  HENT:     Head: Normocephalic.  Cardiovascular:     Rate and Rhythm: Normal rate.     Pulses: Normal pulses.  Pulmonary:     Effort: Pulmonary effort is normal.   Abdominal:     General: Abdomen is flat.  Neurological:     General: No focal deficit present.     Mental Status: She is alert and oriented to person, place, and time.  Psychiatric:        Mood and Affect: Mood normal.        Behavior: Behavior normal.        Thought Content: Thought content normal.     Assessment & Plan:  H/O mastectomy, left  History of augmentation of right breast  History of reconstruction of left breast  Atypical ductal hyperplasia of left breast  Overall the patient is doing extremely well.  She is planning on an MRI on her left breast and a mammogram on her right breast in the next month or so.  After this we can follow her with every 2 to 3-year ultrasounds of the reconstructed breast.  It will be nice to have a baseline MRI for the left breast.   Pictures were obtained of the patient and placed in the chart with the patient's or guardian's permission. We will plan a follow-up in 1 year.  [place], DO

## 2020-01-11 ENCOUNTER — Other Ambulatory Visit: Payer: Self-pay | Admitting: General Surgery

## 2020-01-11 DIAGNOSIS — Z1239 Encounter for other screening for malignant neoplasm of breast: Secondary | ICD-10-CM

## 2020-01-11 DIAGNOSIS — Z9189 Other specified personal risk factors, not elsewhere classified: Secondary | ICD-10-CM

## 2020-01-18 ENCOUNTER — Other Ambulatory Visit: Payer: Self-pay

## 2020-01-18 ENCOUNTER — Ambulatory Visit (INDEPENDENT_AMBULATORY_CARE_PROVIDER_SITE_OTHER): Payer: 59 | Admitting: Dermatology

## 2020-01-18 DIAGNOSIS — L578 Other skin changes due to chronic exposure to nonionizing radiation: Secondary | ICD-10-CM

## 2020-01-18 DIAGNOSIS — L700 Acne vulgaris: Secondary | ICD-10-CM

## 2020-01-18 MED ORDER — TRETINOIN 0.025 % EX CREA: TOPICAL_CREAM | Freq: Every day | CUTANEOUS | 0 refills | Status: DC

## 2020-01-18 NOTE — Patient Instructions (Addendum)
Topical retinoid medications like tretinoin/Retin-A, adapalene/Differin, tazarotene/Fabior, and Epiduo/Epiduo Forte can cause dryness and irritation when first started. Only apply a pea-sized amount to the entire affected area. Avoid applying it around the eyes, edges of mouth and creases at the nose. If you experience irritation, use a good moisturizer first and/or apply the medicine less often. If you are doing well with the medicine, you can increase how often you use it until you are applying every night. Be careful with sun protection while using this medication as it can make you sensitive to the sun. This medicine should not be used by pregnant women.    Topical Retinoids  Your dermatologist has prescribed a topical retinoid. These medications are commonly used to treat acne, as well as a variety of other conditions. Retinoids are derivatives of vitamin A and include tretinoin, Retin-A, Atralin, Ziana, Veltin, Tretin-X, Avita, Renova, adapalene, Differin, Epiduo, and Tazorac.  This cream or gel is the most important component of an acne treatment regimen. It is the only class of medications that makes a difference in the long run in treating acne.  It will help you clear up and, more importantly, keep you clear.  It works slowly to decrease clogging of pores by preventing the collection of dead skin cells and oil in the pores.  In order for your acne to improve, it is essential that you read the information below so that it is used properly. Failure to use this medication properly will make it very difficult for your condition to improve.  It is recommended you use this medication for as long as you are acne-prone.    Do not use a topical retinoid if you are pregnant or planning to become pregnant.   1) Retinoids work by preventing the clogging of pores. Because they are much more effective at preventing acne, they are not recommended as a spot treatment. The entire area in which pimples appear  must be treated.  2) Retinoids are usually applied at night because the sunlight may inactivate some preparations.  3)  These medications are powerful, and only a pea-sized amount at bedtime is needed to treat the entire face. Using more medication will not make it work faster, but will lead to additional irritation. Proper application of a pea-sized amount to your face can be achieved by dotting the central forehead, nose, and chin, and then spreading these smaller portions left and right to cover the sides of the forehead and cheeks.  4)  Retinoid medications work on the microscopic clogged pores to prevent future pimple formation. Because of this, it may take several months to see the effects of the medication.    5)  Some patients notice more breakouts in the first few weeks of treatment with topical retinoids. It is important to continue the medication through the initial flare in order to allow the acne to clear later on.  6)  Side effects are common and include redness, soreness, itchiness, burning, peeling, and irritation. The area around the eyes and mouth are particularly sensitive to retinoids and irritation is more common in these areas. These symptoms are more common if more than a pea-sized amount is used.  7) You will note some dryness of your skin while on this medication, and so moisturizing the skin is helpful. Apply a moisturizer to your face prior to or just after applying the retinoid. It will not interfere with the medication.  You can also moisturize any time your face feels dry. Moisturizers with  sunscreen (SPF 15 or higher) are recommended for during the day as some patients get sunburned more quickly while using this medication.  8) If you develop significant irritation and redness, stop the medication for a day or two so that your skin recovers and apply a moisturizer as needed. Then restart this medication on an every other night schedule until your skin adjusts and you can  tolerate it every night.  9) Do not apply any benzoyl peroxide products (creams, gels, or cleansers) at nighttime as they may interfere with the retinoid. Use them in the morning.    10) Avoid harsh cleansers as they promote drying and irritation. Gentle cleansers are recommended unless instructed otherwise by your dermatologist. Examples of gentle skin cleansers include Purpose, Cetaphil, Aveeno, and CeraVe.  11) Stop the topical retinoid one week prior to any procedures such as waxing, facials, and chemical peels or travel to sunny destinations such as the beach or windy environments such as ski slopes.       Gentle Skin Care Guide  1. Bathe no more than once a day.  2. Avoid bathing in hot water  3. Use a mild soap like Dove, Vanicream, Cetaphil, CeraVe. Can use Lever 2000 or Cetaphil antibacterial soap  4. Use soap only where you need it. On most days, use it under your arms, between your legs, and on your feet. Let the water rinse other areas unless visibly dirty.  5. When you get out of the bath/shower, use a towel to gently blot your skin dry, don't rub it.  6. While your skin is still a little damp, apply a moisturizing cream such as Vanicream, CeraVe, Cetaphil, Eucerin, Sarna lotion or plain Vaseline Jelly. For hands apply Neutrogena [place] Hand Cream or Excipial Hand Cream.  7. Reapply moisturizer any time you start to itch or feel dry.  8. Sometimes using free and clear laundry detergents can be helpful. Fabric softener sheets should be avoided. Downy Free & Gentle liquid, or any liquid fabric softener that is free of dyes and perfumes, it acceptable to use  9. If your doctor has given you prescription creams you may apply moisturizers over them      Recommend daily broad spectrum sunscreen SPF 30+ to sun-exposed areas, reapply every 2 hours as needed. Call for new or changing lesions.     Recommend taking Heliocare sun protection supplement daily in sunny weather  for additional sun protection. For maximum protection on the sunniest days, you can take up to 2 capsules of regular Heliocare OR take 1 capsule of Heliocare Ultra. For prolonged exposure (such as a full day in the sun), you can repeat your dose of the supplement 4 hours after your first dose. Heliocare can be purchased at [facility] or at [email or web address].

## 2020-01-18 NOTE — Progress Notes (Signed)
° °  Follow-Up Visit   Subjective  Robin Riley is a 44 y.o. female who presents for the following: Acne  She has acne mainly on face, present for many years, bothersome, not currently using any treatments for it.  The following portions of the chart were reviewed this encounter and updated as appropriate: Tobacco   Allergies   Meds   Problems   Med Hx   Surg Hx   Fam Hx       Review of Systems: No other skin or systemic complaints except as noted in HPI or Assessment and Plan.   Objective  Well appearing patient in no apparent distress; mood and affect are within normal limits.  A focused examination was performed including face, neck, chest and back. Relevant physical exam findings are noted in the Assessment and Plan.  Objective  face: Open comedones at the face particularly along jaw   Assessment & Plan  Acne vulgaris face  Chronic, not at goal bothersome  Start tretinoin 0.025% cream. Apply pea sized amount to the face, starting every other night then increasing to every night.   Topical retinoid medications like tretinoin/Retin-A, adapalene/Differin, tazarotene/Fabior, and Epiduo/Epiduo Forte can cause dryness and irritation when first started. Only apply a pea-sized amount to the entire affected area. Avoid applying it around the eyes, edges of mouth and creases at the nose. If you experience irritation, use a good moisturizer first and/or apply the medicine less often. If you are doing well with the medicine, you can increase how often you use it until you are applying every night. Be careful with sun protection while using this medication as it can make you sensitive to the sun. This medicine should not be used by pregnant women.    tretinoin (RETIN-A) 0.025 % cream - face  Actinic Damage - Significant and diffuse - chronic, secondary to cumulative UV radiation exposure/sun exposure over time - diffuse scaly erythematous macules with underlying dyspigmentation -  Recommend daily broad spectrum sunscreen SPF 30+ to sun-exposed areas, reapply every 2 hours as needed. - Recommend FBSE  - Call for new or changing lesions.  Return in about 2 months (around 03/19/2020) for TBSE and recheck acne.   I, Harriett Sine, CMA, am acting as scribe for Forest Gleason, MD.  Documentation: I have reviewed the above documentation for accuracy and completeness, and I agree with the above.  Forest Gleason, MD

## 2020-01-21 ENCOUNTER — Encounter: Payer: Self-pay | Admitting: Dermatology

## 2020-01-24 ENCOUNTER — Telehealth: Payer: Self-pay

## 2020-01-24 NOTE — Telephone Encounter (Signed)
Pt called and asked if there is an alternative to the tretinoin 0.025% cream. It was approved by her insurance but is going to be $100+ out of pocket.

## 2020-01-24 NOTE — Telephone Encounter (Signed)
Left vm for pt to call back and sent mychart message.

## 2020-01-24 NOTE — Telephone Encounter (Signed)
Unfortunately there is no alternative that would be cheaper with her insurance as that one is the best price typically available.  However, the price with a coupon with the good Rx coupon is much much lower than that.  It should be less than $25 for the 20 g tube of tretinoin 0.025% cream from [facility] with the good Rx coupon card or about $28 from [facility].    We can send her the link to get the discount for tretinoin 0.025% cream 20 gram tube at [employer] or [facility], or she can swing by and pick up on one of the cards that work with all prescriptions and all pharmacies that she can keep in her wallet.  We will need to send in the smaller tube  (20 grams) to her preferred pharmacy .  [facility] Coupon   [email or web address]ZSI6IDE2Mzc3Nzc0MjAuOTY2MzI1NSwgInByaWNlIjogMjIuMTI1OTk5NDUwNjgzNTk0LCAibGF0IjogbnVsbCwgImxvbiI6IG51bGwsICJkaXN0YW5jZV9taSI6IG51bGwsICJ6aXBfY29kZSI6IG51bGwsICJzdGF0ZSI6ICJOQyIsICJwcmljZV1maWx0ZXJzIjogWyJhcHBlbmRfdG9wX3N0YXRlX3BoYXJtYWN5IiwgImluY19jbW8iLCAiaW5jX2dtbyIsICJpbmNfa3NjX25wIiwgImluY2x1ZGVfZXNyeCIsICJpbmNsdWRlX2dvbGRfcHJpY2VzIiwgImluY2x1ZGVfb25saW5lX2NwYyIsICJpbmNsdWRlX3JlZ2lzdGVyZWRfdXNlcl9wcmljZXMiXSwgInBoYXJtX2ZpbHRlcnMiOiBudWxsLCAicG9zX2NhbXBhaWduIjogIiJ9&price_tab=coupons&)  [facility]  coupon  [email or web address]&extras=[email or web address]-OnxE%3D+soDN7XsibmV0d29yayI6IG51bGwsICJzZWFyY2hfdGltZSI6IDE2Mzc3Nzc0MjAuOTY2MzI1NSwgInByaWNlIjogMjcuNzQwMDAxNjc4NDY2Nzk3LCAibGF0IjogbnVsbCwgImxvbiI6IG51bGwsICJkaXN0YW5jZV9taSI6IG51bGwsICJ6aXBfY29kZSI6IG51bGwsICJzdGF0ZSI6ICJOQyIsICJwcmljZV50maWx0ZXJzIjogWyJhcHBlbmRfdG9wX3N0YXRlX3BoYXJtYWN5IiwgImluY19jbW8iLCAiaW5jX2dtbyIsICJpbmNfa3NjX25wIiwgImluY2x1ZGVfZXNyeCIsICJpbmNsdWRlX2dvbGRfcHJpY2VzIiwgImluY2x1ZGVfb25saW5lX2NwYyIsICJpbmNsdWRlX3JlZ2lzdGVyZWRfdXNlcl9wcmljZXMiXSwgInBoYXJtX2ZpbHRlcnMiOiBudWxsLCAicG9zX2NhbXBhaWduIjogIiJ9&price_tab=coupons&  Thank you!

## 2020-01-29 ENCOUNTER — Other Ambulatory Visit: Payer: Self-pay

## 2020-01-29 DIAGNOSIS — L700 Acne vulgaris: Secondary | ICD-10-CM

## 2020-01-29 MED ORDER — TRETINOIN 0.025 % EX CREA: TOPICAL_CREAM | Freq: Every day | CUTANEOUS | 0 refills | Status: DC

## 2020-01-29 NOTE — Progress Notes (Signed)
Cost of medication too expensive at original pharmacy.

## 2020-02-04 ENCOUNTER — Ambulatory Visit
Admission: RE | Admit: 2020-02-04 | Discharge: 2020-02-04 | Disposition: A | Discharge status: Home / Self Care | Payer: 59 | Source: Ambulatory Visit | Attending: General Surgery | Admitting: General Surgery

## 2020-02-04 DIAGNOSIS — Z1239 Encounter for other screening for malignant neoplasm of breast: Secondary | ICD-10-CM

## 2020-02-04 DIAGNOSIS — N6489 Other specified disorders of breast: Secondary | ICD-10-CM | POA: Diagnosis not present

## 2020-02-04 DIAGNOSIS — Z9189 Other specified personal risk factors, not elsewhere classified: Secondary | ICD-10-CM

## 2020-02-04 MED ORDER — GADOBUTROL 1 MMOL/ML IV SOLN
6.0000 mL | Freq: Once | INTRAVENOUS | Status: AC | PRN
  Administered 2020-02-04: 6 mL via INTRAVENOUS

## 2020-03-12 ENCOUNTER — Encounter: Payer: Self-pay | Admitting: Dermatology

## 2020-03-21 ENCOUNTER — Ambulatory Visit: Payer: 59 | Admitting: Dermatology

## 2020-03-29 ENCOUNTER — Other Ambulatory Visit: Payer: Self-pay | Admitting: Internal Medicine

## 2020-03-29 MED ORDER — CLOBETASOL PROPIONATE 0.05 % EX LOTN: TOPICAL_LOTION | CUTANEOUS | 1 refills | Status: DC

## 2020-04-01 ENCOUNTER — Other Ambulatory Visit: Payer: Self-pay | Admitting: Internal Medicine

## 2020-04-01 ENCOUNTER — Other Ambulatory Visit: Payer: Self-pay | Admitting: Obstetrics & Gynecology

## 2020-04-01 DIAGNOSIS — Z6822 Body mass index (BMI) 22.0-22.9, adult: Secondary | ICD-10-CM | POA: Diagnosis not present

## 2020-04-01 DIAGNOSIS — Z01419 Encounter for gynecological examination (general) (routine) without abnormal findings: Secondary | ICD-10-CM | POA: Diagnosis not present

## 2020-04-01 DIAGNOSIS — Z1231 Encounter for screening mammogram for malignant neoplasm of breast: Secondary | ICD-10-CM

## 2020-04-01 LAB — HM PAP SMEAR: HM Pap smear: NORMAL

## 2020-05-16 ENCOUNTER — Inpatient Hospital Stay: Admission: RE | Admit: 2020-05-16 | Payer: 59 | Source: Ambulatory Visit

## 2020-05-17 ENCOUNTER — Encounter: Payer: Self-pay | Admitting: Internal Medicine

## 2020-05-17 ENCOUNTER — Other Ambulatory Visit: Payer: Self-pay

## 2020-05-17 ENCOUNTER — Other Ambulatory Visit: Payer: Self-pay | Admitting: Internal Medicine

## 2020-05-17 ENCOUNTER — Ambulatory Visit (INDEPENDENT_AMBULATORY_CARE_PROVIDER_SITE_OTHER): Payer: 59 | Admitting: Internal Medicine

## 2020-05-17 VITALS — BP 94/68 | HR 87 | Temp 97.5°F | Resp 14 | Ht 63.0 in | Wt 128.2 lb

## 2020-05-17 DIAGNOSIS — R634 Abnormal weight loss: Secondary | ICD-10-CM | POA: Diagnosis not present

## 2020-05-17 DIAGNOSIS — E538 Deficiency of other specified B group vitamins: Secondary | ICD-10-CM | POA: Diagnosis not present

## 2020-05-17 DIAGNOSIS — E559 Vitamin D deficiency, unspecified: Secondary | ICD-10-CM

## 2020-05-17 DIAGNOSIS — Z0000 Encounter for general adult medical examination without abnormal findings: Secondary | ICD-10-CM

## 2020-05-17 DIAGNOSIS — Z800 Family history of malignant neoplasm of digestive organs: Secondary | ICD-10-CM

## 2020-05-17 LAB — COMPREHENSIVE METABOLIC PANEL
ALT: 24 U/L (ref 0–35)
AST: 21 U/L (ref 0–37)
Albumin: 4.3 g/dL (ref 3.5–5.2)
Alkaline Phosphatase: 51 U/L (ref 39–117)
BUN: 15 mg/dL (ref 6–23)
CO2: 31 mEq/L (ref 19–32)
Calcium: 9.6 mg/dL (ref 8.4–10.5)
Chloride: 104 mEq/L (ref 96–112)
Creatinine, Ser: 0.53 mg/dL (ref 0.40–1.20)
GFR: 110.9 mL/min (ref >60.00)
Glucose, Bld: 82 mg/dL (ref 70–99)
Potassium: 4.3 mEq/L (ref 3.5–5.1)
Sodium: 141 mEq/L (ref 135–145)
Total Bilirubin: 0.5 mg/dL (ref 0.2–1.2)
Total Protein: 6.5 g/dL (ref 6.0–8.3)

## 2020-05-17 LAB — LIPID PANEL
Cholesterol: 197 mg/dL (ref 0–200)
HDL: 77.3 mg/dL (ref >39.00)
LDL Cholesterol: 112 mg/dL — ABNORMAL HIGH (ref 0–99)
NonHDL: 119.7
Total CHOL/HDL Ratio: 3
Triglycerides: 41 mg/dL (ref 0.0–149.0)
VLDL: 8.2 mg/dL (ref 0.0–40.0)

## 2020-05-17 LAB — VITAMIN D 25 HYDROXY (VIT D DEFICIENCY, FRACTURES): VITD: 48.28 ng/mL (ref 30.00–100.00)

## 2020-05-17 LAB — VITAMIN B12: Vitamin B-12: 413 pg/mL (ref 211–911)

## 2020-05-17 LAB — TSH: TSH: 1.33 u[IU]/mL (ref 0.35–4.50)

## 2020-05-17 MED ORDER — CYANOCOBALAMIN 1000 MCG/ML IJ SOLN: 1000.0000 ug | INTRAMUSCULAR | 1 refills | Status: DC

## 2020-05-17 NOTE — Progress Notes (Signed)
Patient ID: Robin Riley, female    DOB: 13-Aug-1975  Age: 45 y.o. MRN: [id number]  The patient is here for annual preventive examination and management of other chronic and acute problems.  This visit occurred during the SARS-CoV-2 public health emergency.  Safety protocols were in place, including screening questions prior to the visit, additional usage of staff PPE, and extensive cleaning of exam room while observing appropriate contact time as indicated for disinfecting solutions.     The risk factors are reflected in the social history.  The roster of all physicians providing medical care to patient - is listed in the Snapshot section of the chart.  Activities of daily living:  The patient is 100% independent in all ADLs: dressing, toileting, feeding as well as independent mobility  Home safety : The patient has smoke detectors in the home. They wear seatbelts.  There are no firearms at home. There is no violence in the home.   There is no risks for hepatitis, STDs or HIV. There is no   history of blood transfusion. They have no travel history to infectious disease endemic areas of the world.  The patient has seen their dentist in the last six month. They have seen their eye doctor in the last year. They admit to slight hearing difficulty with regard to whispered voices and some television programs.  They have deferred audiologic testing in the last year.  They do not  have excessive sun exposure. Discussed the need for sun protection: hats, long sleeves and use of sunscreen if there is significant sun exposure.   Diet: the importance of a healthy diet is discussed. They do have a healthy diet.  The benefits of regular aerobic exercise were discussed. She exercises  5 times per week,  60 minutes.   Depression screen: there are no signs or vegative symptoms of depression- irritability, change in appetite, anhedonia, sadness/tearfullness.   The following portions of the patient's  history were reviewed and updated as appropriate: allergies, current medications, past family history, past medical history,  past surgical history, past social history  and problem list.  Visual acuity was not assessed per patient preference since she has regular follow up with her ophthalmologist. Hearing and body mass index were assessed and reviewed.   During the course of the visit the patient was educated and counseled about appropriate screening and preventive services including : fall prevention , diabetes screening, nutrition counseling, colorectal cancer screening, and recommended immunizations.    CC: The primary encounter diagnosis was Family history of colon cancer requiring screening colonoscopy. Diagnoses of B12 deficiency, Weight loss, Vitamin D deficiency, and Encounter for preventive health examination were also pertinent to this visit.  1) Breast and pelvic exam done by GYN.  Annual high risk MRI right breast  PAP normal Jan 22, MRI and Mammog right breast  Annually Dec and May   Dr Jenetta Downer' [facility] .  Sees [place] every August . Sees Toth her breast surgeon  Annually as well for breast exam.   2) getting remarried in June . Her teenage sons are supportive.   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 .  successful   no complications   History [place] has a past medical history of Acne, Adrenal tumor (2008), Breast cancer ([place]), Cancer [facility]), Chest pain (2008), Gastritis (Sept 2008), History of dysplastic nevus (12/15/2017), and L4 vertebral fracture ([place]).   She has a past surgical history that includes Endometrial ablation (  2012); Tonsillectomy; Breast lumpectomy with radioactive seed localization (Left, 10/04/2014); Nipple sparing mastectomy (Left, 06/03/2017); Breast reconstruction with placement of tissue expander and flex hd (acellular hydrated dermis) (Left, 06/03/2017); Mastectomy; Removal of tissue expander and placement of implant (Left,  09/01/2017); Placement of breast implants (Right, 09/01/2017); and Augmentation mammaplasty.   Her family history includes Cancer in her maternal aunt; Colon cancer (age of onset: 51) in her maternal grandmother; Diabetes in her maternal grandmother; Heart disease in her maternal grandfather; Hypertension in her mother; Osteoporosis in her maternal grandmother and mother.She reports that she has never smoked. She has never used smokeless tobacco. She reports that she does not drink alcohol and does not use drugs.  Outpatient Medications Prior to Visit  Medication Sig Dispense Refill  . calcium citrate-vitamin D 200-200 MG-UNIT TABS Take 1 tablet by mouth daily.    Marland Kitchen tretinoin (RETIN-A) 0.025 % cream Apply topically at bedtime. 20 g 0  . vitamin C (ASCORBIC ACID) 500 MG tablet Take 500 mg by mouth daily.    . cyanocobalamin (,VITAMIN B-12,) 1000 MCG/ML injection Inject 1 mL (1,000 mcg total) into the muscle every 30 (thirty) days. 10 mL 1  . Clobetasol Propionate 0.05 % lotion Use twice daily on hands as needed 118 mL 1   No facility-administered medications prior to visit.    Review of Systems   Patient denies headache, fevers, malaise, unintentional weight loss, skin rash, eye pain, sinus congestion and sinus pain, sore throat, dysphagia,  hemoptysis , cough, dyspnea, wheezing, chest pain, palpitations, orthopnea, edema, abdominal pain, nausea, melena, diarrhea, constipation, flank pain, dysuria, hematuria, urinary  Frequency, nocturia, numbness, tingling, seizures,  Focal weakness, Loss of consciousness,  Tremor, insomnia, depression, anxiety, and suicidal ideation.     Objective:  BP 94/68 (BP Location: Left Arm, Patient Position: Sitting, Cuff Size: Normal)   Pulse 87   Temp (!) 97.5 F (36.4 C) (Oral)   Resp 14   Ht 5\' 3"  (1.6 m)   Wt 128 lb 3.2 oz (58.2 kg)   SpO2 96%   BMI 22.71 kg/m   Physical Exam  General appearance: alert, cooperative and appears stated age Ears: normal TM's  and external ear canals both ears Throat: lips, mucosa, and tongue normal; teeth and gums normal Neck: no adenopathy, no carotid bruit, supple, symmetrical, trachea midline and thyroid not enlarged, symmetric, no tenderness/mass/nodules Back: symmetric, no curvature. ROM normal. No CVA tenderness. Lungs: clear to auscultation bilaterally Heart: regular rate and rhythm, S1, S2 normal, no murmur, click, rub or gallop Abdomen: soft, non-tender; bowel sounds normal; no masses,  no organomegaly Pulses: 2+ and symmetric Skin: Skin color, texture, turgor normal. No rashes or lesions Lymph nodes: Cervical, supraclavicular, and axillary nodes normal.  Assessment & Plan:   Problem List Items Addressed This Visit      Unprioritized   B12 deficiency    managed with monthly IM injections by mother who is an Therapist, sports .  Intrinsic factor ab test was borderline positive  Lab Results  Component Value Date   VHQIONGE95 284 05/17/2020         Relevant Orders   Vitamin B12 (Completed)   Encounter for preventive health examination    age appropriate education and counseling updated, referrals for preventative services and immunizations addressed, dietary and smoking counseling addressed, most recent labs reviewed.  I have personally reviewed and have noted:  1) the patient's medical and social history 2) The pt's use of alcohol, tobacco, and illicit drugs 3) The patient's  current medications and supplements 4) Functional ability including ADL's, fall risk, home safety risk, hearing and visual impairment 5) Diet and physical activities 6) Evidence for depression or mood disorder 7) The patient's height, weight, and BMI have been recorded in the chart  I have made referrals, and provided counseling and education based on review of the above      Weight loss    Weight has stabilized.  Screening tests normal.  Lab Results  Component Value Date   TSH 1.33 05/17/2020   Last vitamin D Lab Results   Component Value Date   VD25OH 48.28 05/17/2020         Relevant Orders   Comprehensive metabolic panel (Completed)   TSH (Completed)   Lipid panel (Completed)    Other Visit Diagnoses    Family history of colon cancer requiring screening colonoscopy    -  Primary   Relevant Orders   Ambulatory referral to Gastroenterology   Vitamin D deficiency       Relevant Orders   VITAMIN D 25 Hydroxy (Vit-D Deficiency, Fractures) (Completed)      I have discontinued Willamae F. Lynch's Clobetasol Propionate. I am also having her maintain her calcium citrate-vitamin D, vitamin C, tretinoin, and cyanocobalamin.  Meds ordered this encounter  Medications  . cyanocobalamin (,VITAMIN B-12,) 1000 MCG/ML injection    Sig: Inject 1 mL (1,000 mcg total) into the muscle every 30 (thirty) days.    Dispense:  10 mL    Refill:  1    Medications Discontinued During This Encounter  Medication Reason  . Clobetasol Propionate 0.05 % lotion   . cyanocobalamin (,VITAMIN B-12,) 1000 MCG/ML injection Reorder    Follow-up: Return in about 1 year (around 05/17/2021).   Crecencio Mc, MD

## 2020-05-17 NOTE — Patient Instructions (Signed)
Health Maintenance, Female Adopting a healthy lifestyle and getting preventive care are important in promoting health and wellness. Ask your health care provider about:  The right schedule for you to have regular tests and exams.  Things you can do on your own to prevent diseases and keep yourself healthy. What should I know about diet, weight, and exercise? Eat a healthy diet  Eat a diet that includes plenty of vegetables, fruits, low-fat dairy products, and lean protein.  Do not eat a lot of foods that are high in solid fats, added sugars, or sodium.   Maintain a healthy weight Body mass index (BMI) is used to identify weight problems. It estimates body fat based on height and weight. Your health care provider can help determine your BMI and help you achieve or maintain a healthy weight. Get regular exercise Get regular exercise. This is one of the most important things you can do for your health. Most adults should:  Exercise for at least 150 minutes each week. The exercise should increase your heart rate and make you sweat (moderate-intensity exercise).  Do strengthening exercises at least twice a week. This is in addition to the moderate-intensity exercise.  Spend less time sitting. Even light physical activity can be beneficial. Watch cholesterol and blood lipids Have your blood tested for lipids and cholesterol at 47 years of age, then have this test every 5 years. Have your cholesterol levels checked more often if:  Your lipid or cholesterol levels are high.  You are older than 47 years of age.  You are at high risk for heart disease. What should I know about cancer screening? Depending on your health history and family history, you may need to have cancer screening at various ages. This may include screening for:  Breast cancer.  Cervical cancer.  Colorectal cancer.  Skin cancer.  Lung cancer. What should I know about heart disease, diabetes, and high blood  pressure? Blood pressure and heart disease  High blood pressure causes heart disease and increases the risk of stroke. This is more likely to develop in people who have high blood pressure readings, are of African descent, or are overweight.  Have your blood pressure checked: ? Every 3-5 years if you are 18-39 years of age. ? Every year if you are 40 years old or older. Diabetes Have regular diabetes screenings. This checks your fasting blood sugar level. Have the screening done:  Once every three years after age 40 if you are at a normal weight and have a low risk for diabetes.  More often and at a younger age if you are overweight or have a high risk for diabetes. What should I know about preventing infection? Hepatitis B If you have a higher risk for hepatitis B, you should be screened for this virus. Talk with your health care provider to find out if you are at risk for hepatitis B infection. Hepatitis C Testing is recommended for:  Everyone born from 1945 through 1965.  Anyone with known risk factors for hepatitis C. Sexually transmitted infections (STIs)  Get screened for STIs, including gonorrhea and chlamydia, if: ? You are sexually active and are younger than 47 years of age. ? You are older than 47 years of age and your health care provider tells you that you are at risk for this type of infection. ? Your sexual activity has changed since you were last screened, and you are at increased risk for chlamydia or gonorrhea. Ask your health care provider   if you are at risk.  Ask your health care provider about whether you are at high risk for HIV. Your health care provider may recommend a prescription medicine to help prevent HIV infection. If you choose to take medicine to prevent HIV, you should first get tested for HIV. You should then be tested every 3 months for as long as you are taking the medicine. Pregnancy  If you are about to stop having your period (premenopausal) and  you may become pregnant, seek counseling before you get pregnant.  Take 400 to 800 micrograms (mcg) of folic acid every day if you become pregnant.  Ask for birth control (contraception) if you want to prevent pregnancy. Osteoporosis and menopause Osteoporosis is a disease in which the bones lose minerals and strength with aging. This can result in bone fractures. If you are 65 years old or older, or if you are at risk for osteoporosis and fractures, ask your health care provider if you should:  Be screened for bone loss.  Take a calcium or vitamin D supplement to lower your risk of fractures.  Be given hormone replacement therapy (HRT) to treat symptoms of menopause. Follow these instructions at home: Lifestyle  Do not use any products that contain nicotine or tobacco, such as cigarettes, e-cigarettes, and chewing tobacco. If you need help quitting, ask your health care provider.  Do not use street drugs.  Do not share needles.  Ask your health care provider for help if you need support or information about quitting drugs. Alcohol use  Do not drink alcohol if: ? Your health care provider tells you not to drink. ? You are pregnant, may be pregnant, or are planning to become pregnant.  If you drink alcohol: ? Limit how much you use to 0-1 drink a day. ? Limit intake if you are breastfeeding.  Be aware of how much alcohol is in your drink. In the [place] one drink equals one 12 oz bottle of beer (355 mL), one 5 oz glass of wine (148 mL), or one 1 oz glass of hard liquor (44 mL). General instructions  Schedule regular health, dental, and eye exams.  Stay current with your vaccines.  Tell your health care provider if: ? You often feel depressed. ? You have ever been abused or do not feel safe at home. Summary  Adopting a healthy lifestyle and getting preventive care are important in promoting health and wellness.  Follow your health care provider's instructions about healthy  diet, exercising, and getting tested or screened for diseases.  Follow your health care provider's instructions on monitoring your cholesterol and blood pressure. This information is not intended to replace advice given to you by your health care provider. Make sure you discuss any questions you have with your health care provider. Document Revised: 02/09/2018 Document Reviewed: 02/09/2018 Elsevier Patient Education  2021 Elsevier Inc.  

## 2020-05-19 NOTE — Assessment & Plan Note (Signed)
Weight has stabilized.  Screening tests normal.  Lab Results  Component Value Date   TSH 1.33 05/17/2020   Last vitamin D Lab Results  Component Value Date   VD25OH 48.28 05/17/2020

## 2020-05-19 NOTE — Assessment & Plan Note (Signed)

## 2020-05-19 NOTE — Assessment & Plan Note (Signed)
managed with monthly IM injections by mother who is an Therapist, sports .  Intrinsic factor ab test was borderline positive  Lab Results  Component Value Date   VITAMINB12 413 05/17/2020

## 2020-05-28 ENCOUNTER — Other Ambulatory Visit: Payer: Self-pay | Admitting: Dermatology

## 2020-05-28 DIAGNOSIS — L700 Acne vulgaris: Secondary | ICD-10-CM

## 2020-05-30 ENCOUNTER — Encounter: Payer: Self-pay | Admitting: *Deleted

## 2020-07-11 ENCOUNTER — Other Ambulatory Visit: Payer: Self-pay

## 2020-07-11 ENCOUNTER — Ambulatory Visit
Admission: RE | Admit: 2020-07-11 | Discharge: 2020-07-11 | Disposition: A | Discharge status: Home / Self Care | Payer: 59 | Source: Ambulatory Visit | Attending: Internal Medicine | Admitting: Internal Medicine

## 2020-07-11 DIAGNOSIS — Z1231 Encounter for screening mammogram for malignant neoplasm of breast: Secondary | ICD-10-CM

## 2020-09-03 ENCOUNTER — Other Ambulatory Visit: Payer: Self-pay

## 2020-09-03 MED FILL — Cyanocobalamin Inj 1000 MCG/ML: INTRAMUSCULAR | 90 days supply | Qty: 3 | Fill #0 | Status: AC

## 2020-09-20 ENCOUNTER — Other Ambulatory Visit: Payer: Self-pay

## 2020-09-20 MED ORDER — CARESTART COVID-19 HOME TEST VI KIT
PACK | 0 refills | Status: DC
  Filled 2020-09-20: qty 2, 4d supply, fill #0

## 2020-10-28 DIAGNOSIS — R300 Dysuria: Secondary | ICD-10-CM

## 2020-10-30 ENCOUNTER — Other Ambulatory Visit
Admission: RE | Admit: 2020-10-30 | Discharge: 2020-10-30 | Disposition: A | Discharge status: Home / Self Care | Payer: 59 | Attending: Internal Medicine | Admitting: Internal Medicine

## 2020-10-30 DIAGNOSIS — R300 Dysuria: Secondary | ICD-10-CM | POA: Insufficient documentation

## 2020-10-30 LAB — URINALYSIS, ROUTINE W REFLEX MICROSCOPIC
Bacteria, UA: NONE SEEN
Bilirubin Urine: NEGATIVE
Glucose, UA: NEGATIVE mg/dL
Hgb urine dipstick: NEGATIVE
Ketones, ur: NEGATIVE mg/dL
Nitrite: NEGATIVE
Protein, ur: NEGATIVE mg/dL
Specific Gravity, Urine: 1.002 — ABNORMAL LOW (ref 1.005–1.030)
pH: 6 (ref 5.0–8.0)

## 2020-10-30 NOTE — Addendum Note (Signed)
Addended by: Elpidio Galea T on: 10/30/2020 12:00 PM   Modules accepted: Orders

## 2020-10-30 NOTE — Telephone Encounter (Signed)
Orders placed and patient will be going to hospital.

## 2020-10-31 LAB — URINE CULTURE: Culture: NO GROWTH

## 2020-11-01 ENCOUNTER — Other Ambulatory Visit: Payer: Self-pay

## 2020-11-01 DIAGNOSIS — N760 Acute vaginitis: Secondary | ICD-10-CM | POA: Diagnosis not present

## 2020-11-01 MED ORDER — FLUCONAZOLE 150 MG PO TABS
150.0000 mg | ORAL_TABLET | ORAL | 2 refills | Status: AC
  Filled 2020-11-01: qty 3, 3d supply, fill #0

## 2020-11-01 MED ORDER — NYSTATIN-TRIAMCINOLONE 100000-0.1 UNIT/GM-% EX OINT
TOPICAL_OINTMENT | CUTANEOUS | 2 refills | Status: DC
  Filled 2020-11-01: qty 30, 15d supply, fill #0

## 2020-11-05 NOTE — Telephone Encounter (Signed)
Mycah[employer] message was sent to pt with results. Pt has seen and read the results.

## 2020-11-08 ENCOUNTER — Other Ambulatory Visit: Payer: Self-pay

## 2020-11-08 ENCOUNTER — Encounter: Payer: Self-pay | Admitting: Plastic Surgery

## 2020-11-08 ENCOUNTER — Ambulatory Visit (INDEPENDENT_AMBULATORY_CARE_PROVIDER_SITE_OTHER): Payer: 59 | Admitting: Plastic Surgery

## 2020-11-08 DIAGNOSIS — Z9889 Other specified postprocedural states: Secondary | ICD-10-CM

## 2020-11-08 DIAGNOSIS — Z719 Counseling, unspecified: Secondary | ICD-10-CM

## 2020-11-08 DIAGNOSIS — N6092 Unspecified benign mammary dysplasia of left breast: Secondary | ICD-10-CM

## 2020-11-08 NOTE — Progress Notes (Signed)
Patient ID: Robin Riley, female    DOB: February 17, 1976, 45 y.o.   MRN: [id number]   Chief Complaint  Patient presents with   Follow-up   Breast Problem    The patient is a 45 yrs old female here for a one year follow for breast reconstruction.  In 2017 she had a partial mastectomy for atypical ductal hyperplasia.  She has not had any radiation.  She then had a LEFT mastectomy with reconstruction with silicone implant.  She had an implant placed on the right side for symmetry.   She is 5 feet 3 inches tall and weighs 132 pound. Her preop bra size is 34A.  She has a smooth round high profile mentor 250 cc on the right and a 325 cc on the left.  The right implant was moving laterally at her last visit.  It is doing that still but not any more severely.  She is okay with that right now but may want to have it tacked so that it does not migrate.  No lumps or bumps are noted and she is otherwise doing great.  No areas of concern.  She had an ultrasound last year.   Review of Systems  Constitutional: Negative.   HENT: Negative.    Eyes: Negative.   Respiratory: Negative.    Cardiovascular: Negative.   Gastrointestinal: Negative.   Endocrine: Negative.   Genitourinary: Negative.   Neurological: Negative.   Hematological: Negative.   Psychiatric/Behavioral: Negative.     Past Medical History:  Diagnosis Date   Acne    managed by Nehemiah Massed   Adrenal tumor 2008   found during workup for hypertension   Breast cancer ([place])    Cancer ([place])    Chest pain 2008   negative myoview, attributed to costochondritis   Gastritis Sept 2008   mild, EGD Darreh Allen Norris    History of dysplastic nevus 12/15/2017   Right sup. lat. buttocks. Moderate atypia, limited margins free.   L4 vertebral fracture ([facility])    remote    Past Surgical History:  Procedure Laterality Date   AUGMENTATION MAMMAPLASTY     BREAST LUMPECTOMY WITH RADIOACTIVE SEED LOCALIZATION Left 10/04/2014   Procedure: LEFT BREAST  LUMPECTOMY WITH RADIOACTIVE SEED LOCALIZATION;  Surgeon: Autumn Messing III, MD;  Location: [place];  Service: General;  Laterality: Left;   BREAST RECONSTRUCTION WITH PLACEMENT OF TISSUE EXPANDER AND FLEX HD (ACELLULAR HYDRATED DERMIS) Left 06/03/2017   Procedure: LEFT BREAST RECONSTRUCTION WITH PLACEMENT OF TISSUE EXPANDER AND FLEX HD (ACELLULAR HYDRATED DERMIS);  Surgeon: Wallace Going, DO;  Location: [place];  Service: Plastics;  Laterality: Left;   ENDOMETRIAL ABLATION  2012   MASTECTOMY     NIPPLE SPARING MASTECTOMY Left 06/03/2017   Procedure: LEFT BREAST NIPPLE SPARING MASTECTOMY;  Surgeon: Jovita Kussmaul, MD;  Location: [place];  Service: General;  Laterality: Left;   PLACEMENT OF BREAST IMPLANTS Right 09/01/2017   Procedure: PLACEMENT OF BREAST IMPLANT FOR SYMMETRY;  Surgeon: Wallace Going, DO;  Location: [place];  Service: Plastics;  Laterality: Right;   REMOVAL OF TISSUE EXPANDER AND PLACEMENT OF IMPLANT Left 09/01/2017   Procedure: REMOVAL OF LEFT TISSUE EXPANDER WITH PLACEMENT OF LEFT BREAST IMPLANT;  Surgeon: Wallace Going, DO;  Location: [place];  Service: Plastics;  Laterality: Left;   TONSILLECTOMY        Current Outpatient Medications:    calcium citrate-vitamin D 200-200 MG-UNIT TABS, Take 1  tablet by mouth daily., Disp: , Rfl:    COVID-19 At Home Antigen Test (CARESTART COVID-19 HOME TEST) KIT, Use as directed, Disp: 2 kit, Rfl: 0   cyanocobalamin (,VITAMIN B-12,) 1000 MCG/ML injection, INJECT 1 ML (1,000 MCG TOTAL) INTO THE MUSCLE EVERY 30 DAYS, Disp: 10 mL, Rfl: 1   nystatin-triamcinolone ointment (MYCOLOG), Apply topically as needed., Disp: 30 g, Rfl: 2   tretinoin (RETIN-A) 0.025 % cream, APPLY TOPICALLY TO AFFECTED AREA(S) AT BEDTIME, Disp: 20 g, Rfl: 0   vitamin C (ASCORBIC ACID) 500 MG tablet, Take 500 mg by mouth daily., Disp: , Rfl:    Objective:   There were no vitals filed for this visit.  Physical  Exam Vitals and nursing note reviewed.  Constitutional:      Appearance: Normal appearance.  HENT:     Head: Normocephalic and atraumatic.  Cardiovascular:     Rate and Rhythm: Normal rate.     Pulses: Normal pulses.  Pulmonary:     Effort: Pulmonary effort is normal. No respiratory distress.  Abdominal:     General: Abdomen is flat. There is no distension.     Tenderness: There is no abdominal tenderness.  Skin:    General: Skin is warm.     Capillary Refill: Capillary refill takes less than 2 seconds.     Coloration: Skin is not jaundiced.     Findings: No bruising.  Neurological:     Mental Status: She is alert. Mental status is at baseline.  Psychiatric:        Mood and Affect: Mood normal.        Behavior: Behavior normal.        Thought Content: Thought content normal.    Assessment & Plan:  History of augmentation of right breast  History of reconstruction of left breast  Atypical ductal hyperplasia of left breast We talked about ultrasounds being possible every 3 years and if there was an issue then we would go to MRI.  She feels very comfortable with the implants right now.  No need to do any imaging.  We will plan to see her next year.  She is going to talk to her husband about the right side getting tacked in.   Pictures were obtained of the patient and placed in the chart with the patient's or guardian's permission.   [place], DO

## 2020-12-04 ENCOUNTER — Telehealth: Payer: Self-pay

## 2020-12-04 NOTE — Telephone Encounter (Signed)
Patient wants to schedule procedure with Dr. Allen Norris in December. Clinical staff will follow  up with patient.

## 2020-12-05 NOTE — Telephone Encounter (Signed)
Returned patients call. LVM to call office back. 

## 2021-01-06 ENCOUNTER — Other Ambulatory Visit: Payer: Self-pay

## 2021-01-06 MED ORDER — QUICKVUE AT-HOME COVID-19 TEST VI KIT
PACK | 0 refills | Status: DC
  Filled 2021-01-06: qty 2, 4d supply, fill #0

## 2021-01-15 ENCOUNTER — Other Ambulatory Visit: Payer: Self-pay

## 2021-01-15 ENCOUNTER — Encounter: Payer: Self-pay | Admitting: Adult Health

## 2021-01-15 ENCOUNTER — Telehealth (INDEPENDENT_AMBULATORY_CARE_PROVIDER_SITE_OTHER): Payer: 59 | Admitting: Adult Health

## 2021-01-15 VITALS — Ht <= 58 in | Wt 128.0 lb

## 2021-01-15 DIAGNOSIS — J069 Acute upper respiratory infection, unspecified: Secondary | ICD-10-CM | POA: Insufficient documentation

## 2021-01-15 DIAGNOSIS — H938X2 Other specified disorders of left ear: Secondary | ICD-10-CM | POA: Insufficient documentation

## 2021-01-15 NOTE — Progress Notes (Signed)
Virtual Visit via Video Note  I connected with Robin Riley on 01/15/21 at  1:30 PM EST by a video enabled telemedicine application and verified that I am speaking with the correct person using two identifiers.  Location: Patient: at home  Provider: Provider: Provider's office at  [facility], [place].      I discussed the limitations of evaluation and management by telemedicine and the availability of in person appointments. The patient expressed understanding and agreed to proceed.  History of Present Illness: Onset 01/09/21 with head congestion/ nasal congestion. Covid negative. Denies any cough. Occasional tickle in throat. Pain in left ear with swallowing " just sounds like fluid". Fluid in both ears.  Sinus pressure. She has Mucinex DM but has not tried.  She has not tried any over the counter medications.  Denies any known exposures. She is in healthcare.  Patient  denies any fever, body aches,chills, rash, chest pain, shortness of breath, nausea, vomiting, or diarrhea.  No LMP recorded. Patient has had an ablation.    Observations/Objective:  Patient is alert and oriented and responsive to questions Engages in conversation with provider. Speaks in full sentences without any pauses without any shortness of breath or distress.    Assessment and Plan:  Viral upper respiratory tract infection - Plan: COVID-19, Flu A+B and RSV  Sensation of fullness in left ear   Recommend symptomatic treatment given URI symptoms, discussed symptom management. Afrin nasal spray PRN per package for 3 days only, nasal saline, Mucinex DM. Increase fluids. Can consider steroid dose pack if symptoms persist. Discussed reg flags.  Follow Up Instructions: Return in about 4 days (around 01/19/2021), or if symptoms worsen or fail to improve, for Go to Emergency room/ urgent care if worse, at any time for any worsening symptoms.   Advised in person evaluation at  anytime is advised if any symptoms do not improve, worsen or change at any given time.  Red Flags discussed. The patient was given clear instructions to go to ER or return to medical center if any red flags develop, symptoms do not improve, worsen or new problems develop. They verbalized understanding.    I discussed the assessment and treatment plan with the patient. The patient was provided an opportunity to ask questions and all were answered. The patient agreed with the plan and demonstrated an understanding of the instructions.   The patient was advised to call back or seek an in-person evaluation if the symptoms worsen or if the condition fails to improve as anticipated.  I provided 20 minutes of non-face-to-face time during this encounter.   Marcille Buffy, FNP

## 2021-01-15 NOTE — Patient Instructions (Addendum)
Recommend symptomatic treatment given URI symptoms, discussed symptom management. Afrin nasal spray PRN per package for 3 days only, nasal saline, Mucinex DM. Increase fluids.  Covid/flu/rsv ordered if you desire testing just call office and they will schedule for parking lot.  Advised patient call the office or your primary care doctor for an appointment if no improvement within 72 hours or if any symptoms change or worsen at any time  Advised ER or urgent Care if after hours or on weekend. Call 911 for emergency symptoms at any time.Patinet verbalized understanding of all instructions given/reviewed and treatment plan and has no further questions or concerns at this time.       Upper Respiratory Infection, Adult An upper respiratory infection (URI) affects the nose, throat, and upper airways that lead to the lungs. The most common type of URI is often called the common cold. URIs usually get better on their own, without medical treatment. What are the causes? A URI is caused by a germ (virus). You may catch these germs by: Breathing in droplets from an infected person's cough or sneeze. Touching something that has the germ on it (is contaminated) and then touching your mouth, nose, or eyes. What increases the risk? You are more likely to get a URI if: You are very young or very old. You have close contact with others, such as at work, school, or a health care facility. You smoke. You have long-term (chronic) heart or lung disease. You have a weakened disease-fighting system (immune system). You have nasal allergies or asthma. You have a lot of stress. You have poor nutrition. What are the signs or symptoms? Runny or stuffy (congested) nose. Cough. Sneezing. Sore throat. Headache. Feeling tired (fatigue). Fever. Not wanting to eat as much as usual. Pain in your forehead, behind your eyes, and over your cheekbones (sinus pain). Muscle aches. Redness or irritation of the  eyes. Pressure in the ears or face. How is this treated? URIs usually get better on their own within 7-10 days. Medicines cannot cure URIs, but your doctor may recommend certain medicines to help relieve symptoms, such as: Over-the-counter cold medicines. Medicines to reduce coughing (cough suppressants). Coughing is a type of defense against infection that helps to clear the nose, throat, windpipe, and lungs (respiratory system). Take these medicines only as told by your doctor. Medicines to lower your fever. Follow these instructions at home: Activity Rest as needed. If you have a fever, stay home from work or school until your fever is gone, or until your doctor says you may return to work or school. You should stay home until you cannot spread the infection anymore (you are not contagious). Your doctor may have you wear a face mask so you have less risk of spreading the infection. Relieving symptoms Rinse your mouth often with salt water. To make salt water, dissolve -1 tsp (3-6 g) of salt in 1 cup (237 mL) of warm water. Use a cool-mist humidifier to add moisture to the air. This can help you breathe more easily. Eating and drinking  Drink enough fluid to keep your pee (urine) pale yellow. Eat soups and other clear broths. General instructions  Take over-the-counter and prescription medicines only as told by your doctor. Do not smoke or use any products that contain nicotine or tobacco. If you need help quitting, ask your doctor. Avoid being where people are smoking (avoid secondhand smoke). Stay up to date on all your shots (immunizations), and get the flu shot every year.  Keep all follow-up visits. How to prevent the spread of infection to others  Wash your hands with soap and water for at least 20 seconds. If you cannot use soap and water, use hand sanitizer. Avoid touching your mouth, face, eyes, or nose. Cough or sneeze into a tissue or your sleeve or elbow. Do not cough or  sneeze into your hand or into the air. Contact a doctor if: You are getting worse, not better. You have any of these: A fever or chills. Brown or red mucus in your nose. Yellow or brown fluid (discharge)coming from your nose. Pain in your face, especially when you bend forward. Swollen neck glands. Pain when you swallow. White areas in the back of your throat. Get help right away if: You have shortness of breath that gets worse. You have very bad or constant: Headache. Ear pain. Pain in your forehead, behind your eyes, and over your cheekbones (sinus pain). Chest pain. You have long-lasting (chronic) lung disease along with any of these: Making high-pitched whistling sounds when you breathe, most often when you breathe out (wheezing). Long-lasting cough (more than 14 days). Coughing up blood. A change in your usual mucus. You have a stiff neck. You have changes in your: Vision. Hearing. Thinking. Mood. These symptoms may be an emergency. Get help right away. Call 911. Do not wait to see if the symptoms will go away. Do not drive yourself to the hospital. Summary An upper respiratory infection (URI) is caused by a germ (virus). The most common type of URI is often called the common cold. URIs usually get better within 7-10 days. Take over-the-counter and prescription medicines only as told by your doctor. This information is not intended to replace advice given to you by your health care provider. Make sure you discuss any questions you have with your health care provider. Document Revised: 09/18/2020 Document Reviewed: 09/18/2020 Elsevier Patient Education  [employer].

## 2021-01-15 NOTE — Addendum Note (Signed)
Addended by: Leeanne Rio on: 01/15/2021 02:22 PM   Modules accepted: Orders

## 2021-01-15 NOTE — Addendum Note (Signed)
Addended by: Leeanne Rio on: 01/15/2021 02:49 PM   Modules accepted: Orders

## 2021-01-16 ENCOUNTER — Other Ambulatory Visit: Payer: Self-pay

## 2021-01-16 ENCOUNTER — Telehealth: Payer: 59

## 2021-01-16 ENCOUNTER — Other Ambulatory Visit: Payer: Self-pay | Admitting: Adult Health

## 2021-01-16 ENCOUNTER — Encounter: Payer: Self-pay | Admitting: Internal Medicine

## 2021-01-16 DIAGNOSIS — H6502 Acute serous otitis media, left ear: Secondary | ICD-10-CM

## 2021-01-16 DIAGNOSIS — B379 Candidiasis, unspecified: Secondary | ICD-10-CM

## 2021-01-16 MED ORDER — AMOXICILLIN-POT CLAVULANATE 875-125 MG PO TABS: 1.0000 | ORAL_TABLET | Freq: Two times a day (BID) | ORAL | 0 refills | Status: DC

## 2021-01-16 MED ORDER — FLUCONAZOLE 150 MG PO TABS: 150.0000 mg | ORAL_TABLET | Freq: Once | ORAL | 0 refills | Status: AC

## 2021-01-16 MED ORDER — PREDNISONE 10 MG PO TABS
ORAL_TABLET | ORAL | 0 refills | Status: DC
  Filled 2021-01-16: qty 21, 6d supply, fill #0

## 2021-01-16 MED ORDER — AMOXICILLIN-POT CLAVULANATE 875-125 MG PO TABS
1.0000 | ORAL_TABLET | Freq: Two times a day (BID) | ORAL | 0 refills | Status: DC
  Filled 2021-01-16: qty 20, 10d supply, fill #0

## 2021-01-16 MED ORDER — PREDNISONE 10 MG PO TABS: ORAL_TABLET | ORAL | 0 refills | Status: DC

## 2021-01-16 NOTE — Telephone Encounter (Signed)
Patient wanted to make sure that medication from yesterday visit with [facility] is sent to [facility], [place]. Please do not send to [place]. Also per request below please call in medication for a yeast infection, she gets one every time she is on a antibiotic.

## 2021-01-16 NOTE — Progress Notes (Signed)
Meds ordered this encounter  Medications   amoxicillin-clavulanate (AUGMENTIN) 875-125 MG tablet    Sig: Take 1 tablet by mouth 2 (two) times daily.    Dispense:  20 tablet    Refill:  0   predniSONE (STERAPRED UNI-PAK 21 TAB) 10 MG (21) TBPK tablet    Sig: PO: Take 6 tablets on day 1:Take 5 tablets day 2:Take 4 tablets day 3: Take 3 tablets day 4:Take 2 tablets day five: 5 Take 1 tablet day 6    Dispense:  21 tablet    Refill:  0

## 2021-03-13 ENCOUNTER — Other Ambulatory Visit: Payer: Self-pay | Admitting: Dermatology

## 2021-03-13 DIAGNOSIS — L700 Acne vulgaris: Secondary | ICD-10-CM

## 2021-03-18 ENCOUNTER — Encounter: Payer: Self-pay | Admitting: Internal Medicine

## 2021-03-18 ENCOUNTER — Other Ambulatory Visit: Payer: Self-pay

## 2021-03-18 DIAGNOSIS — L700 Acne vulgaris: Secondary | ICD-10-CM

## 2021-03-18 MED ORDER — TRETINOIN 0.025 % EX CREA: TOPICAL_CREAM | CUTANEOUS | 1 refills | Status: DC

## 2021-03-20 ENCOUNTER — Encounter: Payer: Self-pay | Admitting: Internal Medicine

## 2021-03-20 DIAGNOSIS — Z9889 Other specified postprocedural states: Secondary | ICD-10-CM

## 2021-03-20 NOTE — Telephone Encounter (Signed)
I have placed the referral

## 2021-04-09 ENCOUNTER — Other Ambulatory Visit: Payer: Self-pay

## 2021-04-09 MED FILL — Cyanocobalamin Inj 1000 MCG/ML: INTRAMUSCULAR | 90 days supply | Qty: 3 | Fill #1 | Status: CN

## 2021-04-15 ENCOUNTER — Ambulatory Visit (INDEPENDENT_AMBULATORY_CARE_PROVIDER_SITE_OTHER): Payer: No Typology Code available for payment source | Admitting: Plastic Surgery

## 2021-04-15 ENCOUNTER — Ambulatory Visit: Payer: 59 | Admitting: Plastic Surgery

## 2021-04-15 ENCOUNTER — Other Ambulatory Visit: Payer: Self-pay

## 2021-04-15 DIAGNOSIS — Z9012 Acquired absence of left breast and nipple: Secondary | ICD-10-CM

## 2021-04-16 ENCOUNTER — Encounter: Payer: Self-pay | Admitting: Plastic Surgery

## 2021-04-17 ENCOUNTER — Encounter: Payer: Self-pay | Admitting: Plastic Surgery

## 2021-04-17 DIAGNOSIS — Z9010 Acquired absence of unspecified breast and nipple: Secondary | ICD-10-CM | POA: Insufficient documentation

## 2021-04-17 NOTE — Progress Notes (Signed)
° °  Subjective:    Patient ID: Robin Riley, female    DOB: 03/02/1976, 46 y.o.   MRN: [id number]  The patient is a 46 year old female here for evaluation of her breast.  She had a partial mastectomy of the left breast in 2017.  She then had a full mastectomy in 2019 on the left breast.  She did not have radiation.  She had a left implant placed on 09/01/2017.  And for symmetry had a right implant placed.  She has a Product manager smooth round high-profile gel 325 cc implant on the left and a 250 cc Mentor smooth round high-profile gel implant on the right.  She is requesting repositioning of the right sided implant.  It takes rotating laterally.  She finds it difficult to reposition it in her clothes.  She also asked about changing her implant size.  The positioning of the implant can also be due to a capsular contracture on the medial aspect of the right breast    Review of Systems  Constitutional: Negative.   HENT: Negative.    Eyes: Negative.   Respiratory: Negative.  Negative for chest tightness.   Cardiovascular: Negative.  Negative for leg swelling.  Gastrointestinal: Negative.   Endocrine: Negative.   Genitourinary: Negative.   Musculoskeletal: Negative.  Negative for back pain.  Skin:  Negative for color change and wound.      Objective:   Physical Exam Constitutional:      Appearance: Normal appearance.  HENT:     Head: Normocephalic and atraumatic.  Cardiovascular:     Rate and Rhythm: Normal rate.     Pulses: Normal pulses.  Pulmonary:     Effort: Pulmonary effort is normal.  Abdominal:     General: There is no distension.     Palpations: Abdomen is soft.     Tenderness: There is no abdominal tenderness.  Musculoskeletal:        General: No swelling or deformity.  Skin:    General: Skin is warm.     Capillary Refill: Capillary refill takes less than 2 seconds.     Coloration: Skin is not jaundiced.  Neurological:     Mental Status: She is alert and oriented to  person, place, and time.  Psychiatric:        Mood and Affect: Mood normal.        Behavior: Behavior normal.        Thought Content: Thought content normal.        Judgment: Judgment normal.       Assessment & Plan:     ICD-10-CM   1. H/O mastectomy, left  Z90.12     2. Acquired absence of left breast  Z90.12       Pictures were obtained of the patient and placed in the chart with the patient's or guardian's permission.  The patient is a good candidate for repositioning of the left breast implant.  I am hesitant to change the size as it may increase her risks of complications.  She decided not to do that and just work on the repositioning of the capsule due to capsule contracture on the right breast.

## 2021-04-18 ENCOUNTER — Encounter: Payer: Self-pay | Admitting: Plastic Surgery

## 2021-04-21 ENCOUNTER — Other Ambulatory Visit: Payer: Self-pay

## 2021-04-29 ENCOUNTER — Encounter: Payer: Self-pay | Admitting: Internal Medicine

## 2021-04-29 DIAGNOSIS — Z01419 Encounter for gynecological examination (general) (routine) without abnormal findings: Secondary | ICD-10-CM

## 2021-04-29 DIAGNOSIS — Z124 Encounter for screening for malignant neoplasm of cervix: Secondary | ICD-10-CM

## 2021-04-30 ENCOUNTER — Other Ambulatory Visit: Payer: Self-pay | Admitting: Internal Medicine

## 2021-04-30 ENCOUNTER — Other Ambulatory Visit: Payer: Self-pay | Admitting: Plastic Surgery

## 2021-04-30 DIAGNOSIS — Z1231 Encounter for screening mammogram for malignant neoplasm of breast: Secondary | ICD-10-CM

## 2021-04-30 LAB — HM PAP SMEAR: HM Pap smear: NORMAL

## 2021-05-20 ENCOUNTER — Telehealth (INDEPENDENT_AMBULATORY_CARE_PROVIDER_SITE_OTHER): Payer: No Typology Code available for payment source | Admitting: Plastic Surgery

## 2021-05-20 ENCOUNTER — Encounter: Payer: Self-pay | Admitting: Plastic Surgery

## 2021-05-20 DIAGNOSIS — T8544XA Capsular contracture of breast implant, initial encounter: Secondary | ICD-10-CM | POA: Diagnosis not present

## 2021-05-20 DIAGNOSIS — Z9889 Other specified postprocedural states: Secondary | ICD-10-CM

## 2021-05-20 DIAGNOSIS — N651 Disproportion of reconstructed breast: Secondary | ICD-10-CM | POA: Diagnosis not present

## 2021-05-20 DIAGNOSIS — Z9012 Acquired absence of left breast and nipple: Secondary | ICD-10-CM | POA: Diagnosis not present

## 2021-05-20 NOTE — Progress Notes (Addendum)
? ?  Subjective:  ? ? Patient ID: Robin Riley, female    DOB: November 06, 1975, 46 y.o.   MRN: [id number] ? ?The patient is a 46 yrs old female joining me by televisit.  She underwent a partial mastectomy of the left breast in 2017 followed by a complete mastectomy in 2019.  She did not have radiation.  A left implant was placed 08/2017 mentor smooth round high profile gel 325 cc.  A mentor smooth round high profile gel implant 250 cc was placed on the right for symmetry.   She is concerned about the right implant rotating laterally which may be from a medial capsule contracture. ? ? ?Review of Systems  ?Constitutional: Negative.   ?Eyes: Negative.   ?Respiratory: Negative.    ?Cardiovascular: Negative.   ?Gastrointestinal: Negative.   ?Endocrine: Negative.   ?Genitourinary: Negative.   ?Musculoskeletal: Negative.   ? ?   ?Objective:  ? Physical Exam ? ? ?   ?Assessment & Plan:  ? ?  ICD-10-CM   ?1. Acquired absence of left breast  Z90.12   ?  ?2. Breast asymmetry following reconstructive surgery  N65.1   ?  ?3. History of reconstruction of left breast  Z98.890   ?  ?4. H/O mastectomy, left  Z90.12   ?  ?5. Capsular contracture of breast implant, initial encounter  T85.44XA   ?  ?  ?Would like to plan on July for right breast capsule release and repositioning. ? ?I connected with  Eli Phillips on 05/20/21 by a video enabled telemedicine application and verified that I am speaking with the correct person using two identifiers. ?  ?I discussed the limitations of evaluation and management by telemedicine. The patient expressed understanding and agreed to proceed. ? ?I spent 15 minutes in the following manner:  review of chart, notes and pictures.  Discussion with patient, review of the plan, documentation and request for pre-authorization. ? ? ? ?

## 2021-05-21 ENCOUNTER — Encounter: Payer: Self-pay | Admitting: Internal Medicine

## 2021-05-22 ENCOUNTER — Other Ambulatory Visit: Payer: Self-pay

## 2021-05-22 ENCOUNTER — Ambulatory Visit (INDEPENDENT_AMBULATORY_CARE_PROVIDER_SITE_OTHER): Payer: No Typology Code available for payment source | Admitting: Internal Medicine

## 2021-05-22 ENCOUNTER — Encounter: Payer: Self-pay | Admitting: Internal Medicine

## 2021-05-22 VITALS — BP 102/74 | HR 71 | Temp 98.0°F | Ht 63.0 in | Wt 128.4 lb

## 2021-05-22 DIAGNOSIS — Z1211 Encounter for screening for malignant neoplasm of colon: Secondary | ICD-10-CM

## 2021-05-22 DIAGNOSIS — R7301 Impaired fasting glucose: Secondary | ICD-10-CM

## 2021-05-22 DIAGNOSIS — Z0000 Encounter for general adult medical examination without abnormal findings: Secondary | ICD-10-CM

## 2021-05-22 DIAGNOSIS — E785 Hyperlipidemia, unspecified: Secondary | ICD-10-CM

## 2021-05-22 DIAGNOSIS — R5383 Other fatigue: Secondary | ICD-10-CM

## 2021-05-22 LAB — CBC WITH DIFFERENTIAL/PLATELET
Basophils Absolute: 0 10*3/uL (ref 0.0–0.1)
Basophils Relative: 1.3 % (ref 0.0–3.0)
Eosinophils Absolute: 0.1 10*3/uL (ref 0.0–0.7)
Eosinophils Relative: 2 % (ref 0.0–5.0)
HCT: 39.9 % (ref 36.0–46.0)
Hemoglobin: 12.8 g/dL (ref 12.0–15.0)
Lymphocytes Relative: 38.2 % (ref 12.0–46.0)
Lymphs Abs: 1.4 10*3/uL (ref 0.7–4.0)
MCHC: 32.1 g/dL (ref 30.0–36.0)
MCV: 84.7 fl (ref 78.0–100.0)
Monocytes Absolute: 0.3 10*3/uL (ref 0.1–1.0)
Monocytes Relative: 7.4 % (ref 3.0–12.0)
Neutro Abs: 1.9 10*3/uL (ref 1.4–7.7)
Neutrophils Relative %: 51.1 % (ref 43.0–77.0)
Platelets: 213 10*3/uL (ref 150.0–400.0)
RBC: 4.71 Mil/uL (ref 3.87–5.11)
RDW: 13.4 % (ref 11.5–15.5)
WBC: 3.7 10*3/uL — ABNORMAL LOW (ref 4.0–10.5)

## 2021-05-22 LAB — COMPREHENSIVE METABOLIC PANEL
ALT: 25 U/L (ref 0–35)
AST: 24 U/L (ref 0–37)
Albumin: 4.7 g/dL (ref 3.5–5.2)
Alkaline Phosphatase: 55 U/L (ref 39–117)
BUN: 8 mg/dL (ref 6–23)
CO2: 31 mEq/L (ref 19–32)
Calcium: 9.5 mg/dL (ref 8.4–10.5)
Chloride: 103 mEq/L (ref 96–112)
Creatinine, Ser: 0.64 mg/dL (ref 0.40–1.20)
GFR: 105.22 mL/min (ref >60.00)
Glucose, Bld: 77 mg/dL (ref 70–99)
Potassium: 4.7 mEq/L (ref 3.5–5.1)
Sodium: 139 mEq/L (ref 135–145)
Total Bilirubin: 0.4 mg/dL (ref 0.2–1.2)
Total Protein: 7 g/dL (ref 6.0–8.3)

## 2021-05-22 LAB — LIPID PANEL
Cholesterol: 175 mg/dL (ref 0–200)
HDL: 74.7 mg/dL (ref >39.00)
LDL Cholesterol: 90 mg/dL (ref 0–99)
NonHDL: 100.2
Total CHOL/HDL Ratio: 2
Triglycerides: 50 mg/dL (ref 0.0–149.0)
VLDL: 10 mg/dL (ref 0.0–40.0)

## 2021-05-22 LAB — TSH: TSH: 1.8 u[IU]/mL (ref 0.35–5.50)

## 2021-05-22 NOTE — Patient Instructions (Signed)
Referral to Dr Allen Norris is in progress ? ? ?

## 2021-05-22 NOTE — Progress Notes (Signed)
The patient is here for annual preventive examination and management of other chronic and acute problems. ?  ?The risk factors are reflected in the social history. ?  ?The roster of all physicians providing medical care to patient - is listed in the Snapshot section of the chart. ?  ?Activities of daily living:  The patient is 100% independent in all ADLs: dressing, toileting, feeding as well as independent mobility ?  ?Home safety : The patient has smoke detectors in the home. They wear seatbelts.  There are no unsecured firearms at home. There is no violence in the home.  ?  ?There is no risks for hepatitis, STDs or HIV. There is no   history of blood transfusion. They have no travel history to infectious disease endemic areas of the world. ?  ?The patient has seen their dentist in the last six month. They have seen their eye doctor in the last year. The patinet  denies slight hearing difficulty with regard to whispered voices and some television programs.  They have deferred audiologic testing in the last year.  They do not  have excessive sun exposure. Discussed the need for sun protection: hats, long sleeves and use of sunscreen if there is significant sun exposure.  ?  ?Diet: the importance of a healthy diet is discussed. They do have a healthy diet. ?  ?The benefits of regular aerobic exercise were discussed. The patient  exercises  3 to 5 days per week  for  60 minutes.  ?  ?Depression screen: there are no signs or vegative symptoms of depression- irritability, change in appetite, anhedonia, sadness/tearfullness. ?  ?The following portions of the patient's history were reviewed and updated as appropriate: allergies, current medications, past family history, past medical history,  past surgical history, past social history  and problem list. ?  ?Visual acuity was not assessed per patient preference since the patient has regular follow up with an  ophthalmologist. Hearing and body mass index were assessed and  reviewed.  ?  ?During the course of the visit the patient was educated and counseled about appropriate screening and preventive services including : fall prevention , diabetes screening, nutrition counseling, colorectal cancer screening, and recommended immunizations.   ? ?Chief Complaint: ? ?1) newly married.  4 sons now (2 from new husband). ?  ? ? ?Review of Symptoms ? ?Patient denies headache, fevers, malaise, unintentional weight loss, skin rash, eye pain, sinus congestion and sinus pain, sore throat, dysphagia,  hemoptysis , cough, dyspnea, wheezing, chest pain, palpitations, orthopnea, edema, abdominal pain, nausea, melena, diarrhea, constipation, flank pain, dysuria, hematuria, urinary  Frequency, nocturia, numbness, tingling, seizures,  Focal weakness, Loss of consciousness,  Tremor, insomnia, depression, anxiety, and suicidal ideation.   ? ?Physical Exam: ? ?BP 102/74 (BP Location: Left Arm, Patient Position: Sitting, Cuff Size: Normal)   Pulse 71   Temp 98 ?F (36.7 ?C) (Oral)   Ht 5' 3"  (1.6 m)   Wt 128 lb 6.4 oz (58.2 kg)   SpO2 98%   BMI 22.75 kg/m?   ? ?General appearance: alert, cooperative and appears stated age ?Ears: normal TM's and external ear canals both ears ?Throat: lips, mucosa, and tongue normal; teeth and gums normal ?Neck: no adenopathy, no carotid bruit, supple, symmetrical, trachea midline and thyroid not enlarged, symmetric, no tenderness/mass/nodules ?Back: symmetric, no curvature. ROM normal. No CVA tenderness. ?Lungs: clear to auscultation bilaterally ?Heart: regular rate and rhythm, S1, S2 normal, no murmur, click, rub or gallop ?Abdomen: soft, non-tender;  bowel sounds normal; no masses,  no organomegaly ?Pulses: 2+ and symmetric ?Skin: Skin color, texture, turgor normal. No rashes or lesions ?Lymph nodes: Cervical, supraclavicular, and axillary nodes normal.  ? ?Assessment and Plan: ? ?Encounter for preventive health examination ?age appropriate education and counseling  updated, referral to Dr Allen Norris for colonoscopy,  and immunizations addressed, dietary and smoking counseling addressed, most recent labs reviewed.  I have personally reviewed and have noted: ?  ?1) the patient's medical and social history ?2) The pt's use of alcohol, tobacco, and illicit drugs ?3) The patient's current medications and supplements ?4) Functional ability including ADL's, fall risk, home safety risk, hearing and visual impairment ?5) Diet and physical activities ?6) Evidence for depression or mood disorder ?7) The patient's height, weight, and BMI have been recorded in the chart  ?  ?I have made referrals, and provided counseling and education based on review of the above ? ? ?Updated Medication List ?Outpatient Encounter Medications as of 05/22/2021  ?Medication Sig  ? calcium citrate-vitamin D 200-200 MG-UNIT TABS Take 1 tablet by mouth daily.  ? tretinoin (RETIN-A) 0.025 % cream APPLY TOPICALLY TO AFFECTED AREA(S) AT BEDTIME  ? vitamin C (ASCORBIC ACID) 500 MG tablet Take 500 mg by mouth daily.  ? [DISCONTINUED] COVID-19 At Home Antigen Test (CARESTART COVID-19 HOME TEST) KIT Use as directed (Patient not taking: Reported on 01/15/2021)  ? [DISCONTINUED] COVID-19 At Home Antigen Test (QUICKVUE AT-HOME COVID-19 TEST) KIT use as directed (Patient not taking: Reported on 01/15/2021)  ? ?No facility-administered encounter medications on file as of 05/22/2021.  ?  ?

## 2021-05-23 ENCOUNTER — Telehealth: Payer: Self-pay

## 2021-05-23 NOTE — Assessment & Plan Note (Addendum)
age appropriate education and counseling updated, referral to Dr Allen Norris for colonoscopy,  and immunizations addressed, dietary and smoking counseling addressed, most recent labs reviewed.  I have personally reviewed and have noted: ?  ?1) the patient's medical and social history ?2) The pt's use of alcohol, tobacco, and illicit drugs ?3) The patient's current medications and supplements ?4) Functional ability including ADL's, fall risk, home safety risk, hearing and visual impairment ?5) Diet and physical activities ?6) Evidence for depression or mood disorder ?7) The patient's height, weight, and BMI have been recorded in the chart  ?  ?I have made referrals, and provided counseling and education based on review of the above ?

## 2021-05-23 NOTE — Telephone Encounter (Signed)
CALLED PATIENT NO ANSWER LEFT VOICEMAIL FOR A CALL BACK ? ?

## 2021-05-26 ENCOUNTER — Telehealth: Payer: Self-pay

## 2021-05-26 ENCOUNTER — Other Ambulatory Visit: Payer: Self-pay

## 2021-05-26 DIAGNOSIS — Z1211 Encounter for screening for malignant neoplasm of colon: Secondary | ICD-10-CM

## 2021-05-26 MED ORDER — SUTAB 1479-225-188 MG PO TABS
12.0000 | ORAL_TABLET | Freq: Once | ORAL | 0 refills | Status: AC
  Filled 2021-05-26: qty 24, 2d supply, fill #0

## 2021-05-26 NOTE — Telephone Encounter (Signed)
PATIENT CALLED BACK AND WANTED TO RESCHEDULE FOR 06/06/2021 SO CALLED ENDO AND SENT NEW COMMUNICATIONS AND NEW REFERRAL OUT ?

## 2021-05-26 NOTE — Progress Notes (Signed)
Gastroenterology Pre-Procedure Review ? ?Request Date: 08/15/2021 ?Requesting Physician: Dr. Allen Norris ? ?PATIENT REVIEW QUESTIONS: The patient responded to the following health history questions as indicated:   ? ?1. Are you having any GI issues? no ?2. Do you have a personal history of Polyps? no ?3. Do you have a family history of Colon Cancer or Polyps? no ?4. Diabetes Mellitus? no ?5. Joint replacements in the past 12 months?no ?6. Major health problems in the past 3 months?no ?7. Any artificial heart valves, MVP, or defibrillator?no ?   ?MEDICATIONS & ALLERGIES:    ?Patient reports the following regarding taking any anticoagulation/antiplatelet therapy:   ?Plavix, Coumadin, Eliquis, Xarelto, Lovenox, Pradaxa, Brilinta, or Effient? no ?Aspirin? no ? ?Patient confirms/reports the following medications:  ?Current Outpatient Medications  ?Medication Sig Dispense Refill  ? Sodium Sulfate-Mag Sulfate-KCl (SUTAB) [phone] MG TABS Take 12 tablets by mouth once for 1 dose. 24 tablet 0  ? calcium citrate-vitamin D 200-200 MG-UNIT TABS Take 1 tablet by mouth daily.    ? tretinoin (RETIN-A) 0.025 % cream APPLY TOPICALLY TO AFFECTED AREA(S) AT BEDTIME 20 g 1  ? vitamin C (ASCORBIC ACID) 500 MG tablet Take 500 mg by mouth daily.    ? ?No current facility-administered medications for this visit.  ? ? ?Patient confirms/reports the following allergies:  ?Allergies  ?Allergen Reactions  ? Sulfa Antibiotics Rash  ? ? ?No orders of the defined types were placed in this encounter. ? ? ?AUTHORIZATION INFORMATION ?Primary Insurance: ?1D#: ?Group #: ? ?Secondary Insurance: ?1D#: ?Group #: ? ?SCHEDULE INFORMATION: ?Date: 08/15/2021 ?Time: ?Location:[facility] ? ?

## 2021-05-27 ENCOUNTER — Encounter: Payer: Self-pay | Admitting: Gastroenterology

## 2021-05-27 ENCOUNTER — Other Ambulatory Visit: Payer: Self-pay

## 2021-05-30 ENCOUNTER — Other Ambulatory Visit: Payer: Self-pay | Admitting: Internal Medicine

## 2021-05-30 ENCOUNTER — Other Ambulatory Visit: Payer: Self-pay

## 2021-05-30 MED ORDER — CYANOCOBALAMIN 1000 MCG/ML IJ SOLN
INTRAMUSCULAR | 1 refills | Status: AC
  Filled 2021-05-30: qty 3, 90d supply, fill #0
  Filled 2021-08-27: qty 3, 90d supply, fill #1
  Filled 2022-02-18 – 2022-03-30 (×2): qty 3, 90d supply, fill #2

## 2021-06-02 ENCOUNTER — Telehealth: Payer: Self-pay

## 2021-06-02 NOTE — Telephone Encounter (Signed)
Patient left a voicemail because she has some questions about her upcoming colonoscopy schedule for 06/06/21. She wants to know if she can wear contacts and eat deli meat  ?

## 2021-06-02 NOTE — Telephone Encounter (Signed)
Called patient back and answered all  her questions  ?

## 2021-06-06 ENCOUNTER — Ambulatory Visit: Payer: No Typology Code available for payment source | Admitting: Anesthesiology

## 2021-06-06 ENCOUNTER — Ambulatory Visit
Admission: RE | Admit: 2021-06-06 | Discharge: 2021-06-06 | Disposition: A | Discharge status: Home / Self Care | Payer: No Typology Code available for payment source | Source: Ambulatory Visit | Attending: Gastroenterology | Admitting: Gastroenterology

## 2021-06-06 ENCOUNTER — Encounter
Admission: RE | Disposition: A | Discharge status: Home / Self Care | Payer: Self-pay | Source: Ambulatory Visit | Attending: Gastroenterology

## 2021-06-06 ENCOUNTER — Encounter: Payer: Self-pay | Admitting: Gastroenterology

## 2021-06-06 ENCOUNTER — Other Ambulatory Visit: Payer: Self-pay

## 2021-06-06 DIAGNOSIS — K635 Polyp of colon: Secondary | ICD-10-CM | POA: Diagnosis not present

## 2021-06-06 DIAGNOSIS — D126 Benign neoplasm of colon, unspecified: Secondary | ICD-10-CM

## 2021-06-06 DIAGNOSIS — Z1211 Encounter for screening for malignant neoplasm of colon: Secondary | ICD-10-CM

## 2021-06-06 DIAGNOSIS — D122 Benign neoplasm of ascending colon: Secondary | ICD-10-CM | POA: Insufficient documentation

## 2021-06-06 DIAGNOSIS — K640 First degree hemorrhoids: Secondary | ICD-10-CM | POA: Diagnosis not present

## 2021-06-06 DIAGNOSIS — K621 Rectal polyp: Secondary | ICD-10-CM

## 2021-06-06 HISTORY — PX: COLONOSCOPY WITH PROPOFOL: SHX5780

## 2021-06-06 HISTORY — PX: POLYPECTOMY: SHX5525

## 2021-06-06 SURGERY — COLONOSCOPY WITH PROPOFOL
Anesthesia: General | Site: Rectum

## 2021-06-06 MED ORDER — PROPOFOL 10 MG/ML IV BOLUS
INTRAVENOUS | Status: DC | PRN
  Administered 2021-06-06: 80 mg via INTRAVENOUS
  Administered 2021-06-06: 40 mg via INTRAVENOUS
  Administered 2021-06-06: 20 mg via INTRAVENOUS
  Administered 2021-06-06: 30 mg via INTRAVENOUS
  Administered 2021-06-06: 20 mg via INTRAVENOUS
  Administered 2021-06-06: 30 mg via INTRAVENOUS
  Administered 2021-06-06 (×3): 20 mg via INTRAVENOUS
  Administered 2021-06-06 (×4): 30 mg via INTRAVENOUS

## 2021-06-06 MED ORDER — STERILE WATER FOR IRRIGATION IR SOLN
Status: DC | PRN
Start: 2021-06-06 — End: 2021-06-06
  Administered 2021-06-06: 250 mL

## 2021-06-06 MED ORDER — SODIUM CHLORIDE 0.9 % IV SOLN: INTRAVENOUS | Status: DC

## 2021-06-06 MED ORDER — LIDOCAINE HCL (CARDIAC) PF 100 MG/5ML IV SOSY
PREFILLED_SYRINGE | INTRAVENOUS | Status: DC | PRN
  Administered 2021-06-06: 50 mg via INTRAVENOUS

## 2021-06-06 MED ORDER — LACTATED RINGERS IV SOLN: INTRAVENOUS | Status: DC

## 2021-06-06 SURGICAL SUPPLY — 9 items
FORCEPS BIOP RAD 4 LRG CAP 4 (CUTTING FORCEPS) IMPLANT
GOWN CVR UNV OPN BCK APRN NK (MISCELLANEOUS) ×4 IMPLANT
GOWN ISOL THUMB LOOP REG UNIV (MISCELLANEOUS) ×6
KIT PRC NS LF DISP ENDO (KITS) ×2 IMPLANT
KIT PROCEDURE OLYMPUS (KITS) ×3
MANIFOLD NEPTUNE II (INSTRUMENTS) ×3 IMPLANT
SNARE COLD EXACTO (MISCELLANEOUS) IMPLANT
TRAP ETRAP POLY (MISCELLANEOUS) IMPLANT
WATER STERILE IRR 250ML POUR (IV SOLUTION) ×3 IMPLANT

## 2021-06-06 NOTE — Op Note (Signed)
[facility] ?Gastroenterology ?Patient Name: Robin Riley ?Procedure Date: 06/06/2021 9:51 AM ?MRN: [id number] ?Account #: [id number] ?Date of Birth: [birth year] ?Admit Type: Outpatient ?Age: 46 ?Room: [facility] OR ROOM 01 ?Gender: Female ?Note Status: Finalized ?Instrument Name: [id number] ?Procedure:             Colonoscopy ?Indications:           Screening for colorectal malignant neoplasm ?Providers:             [name] MD, MD ?Referring MD:          Deborra Medina, MD (Referring MD) ?Medicines:             Propofol per Anesthesia ?Complications:         No immediate complications. ?Procedure:             Pre-Anesthesia Assessment: ?                       - Prior to the procedure, a History and Physical was  ?                       performed, and patient medications and allergies were  ?                       reviewed. The patient's tolerance of previous  ?                       anesthesia was also reviewed. The risks and benefits  ?                       of the procedure and the sedation options and risks  ?                       were discussed with the patient. All questions were  ?                       answered, and informed consent was obtained. Prior  ?                       Anticoagulants: The patient has taken no previous  ?                       anticoagulant or antiplatelet agents. ASA Grade  ?                       Assessment: II - A patient with mild systemic disease.  ?                       After reviewing the risks and benefits, the patient  ?                       was deemed in satisfactory condition to undergo the  ?                       procedure. ?                       After obtaining informed consent, the colonoscope was  ?  passed under direct vision. Throughout the procedure,  ?                       the patient's blood pressure, pulse, and oxygen  ?                       saturations were monitored continuously. The  ?                       Colonoscope  was introduced through the anus and  ?                       advanced to the the cecum, identified by appendiceal  ?                       orifice and ileocecal valve. The colonoscopy was  ?                       performed without difficulty. The patient tolerated  ?                       the procedure well. The quality of the bowel  ?                       preparation was good. ?Findings: ?     The perianal and digital rectal examinations were normal. ?     A 3 mm polyp was found in the ascending colon. The polyp was sessile.  ?     The polyp was removed with a cold biopsy forceps. Resection and  ?     retrieval were complete. ?     A 3 mm polyp was found in the rectum. The polyp was sessile. The polyp  ?     was removed with a cold biopsy forceps. Resection and retrieval were  ?     complete. ?     Non-bleeding internal hemorrhoids were found during retroflexion. The  ?     hemorrhoids were Grade I (internal hemorrhoids that do not prolapse). ?Impression:            - One 3 mm polyp in the ascending colon, removed with  ?                       a cold biopsy forceps. Resected and retrieved. ?                       - One 3 mm polyp in the rectum, removed with a cold  ?                       biopsy forceps. Resected and retrieved. ?                       - Non-bleeding internal hemorrhoids. ?Recommendation:        - Discharge patient to home. ?                       - Resume previous diet. ?                       - Continue present medications. ?                       -  Await pathology results. ?                       - If the pathology report reveals adenomatous tissue,  ?                       then repeat the colonoscopy for surveillance in 7  ?                       years otherwise 10 years. ?Procedure Code(s):     --- Professional --- ?                       [phone], Colonoscopy, flexible; with biopsy, single or  ?                       multiple ?Diagnosis Code(s):     --- Professional --- ?                        Z12.11, Encounter for screening for malignant neoplasm  ?                       of colon ?                       K63.5, Polyp of colon ?                       K62.1, Rectal polyp ?CPT copyright 2019 American Medical Association. All rights reserved. ?The codes documented in this report are preliminary and upon coder review may  ?be revised to meet current compliance requirements. ?[name] MD, MD ?06/06/2021 10:20:45 AM ?This report has been signed electronically. ?Number of Addenda: 0 ?Note Initiated On: 06/06/2021 9:51 AM ?Scope Withdrawal Time: 0 hours 12 minutes 3 seconds  ?Total Procedure Duration: 0 hours 17 minutes 26 seconds  ?Estimated Blood Loss:  Estimated blood loss: none. ?     [facility] ?

## 2021-06-06 NOTE — Anesthesia Postprocedure Evaluation (Signed)
Anesthesia Post Note ? ?Patient: Robin Riley ? ?Procedure(s) Performed: COLONOSCOPY WITH PROPOFOL (Rectum) ?POLYPECTOMY (Rectum) ? ? ?  ?Patient location during evaluation: PACU ?Anesthesia Type: General ?Level of consciousness: awake and alert ?Pain management: pain level controlled ?Vital Signs Assessment: post-procedure vital signs reviewed and stable ?Respiratory status: spontaneous breathing, nonlabored ventilation and respiratory function stable ?Cardiovascular status: blood pressure returned to baseline and stable ?Postop Assessment: no apparent nausea or vomiting ?Anesthetic complications: no ? ? ?No notable events documented. ? ?April Manson ? ? ? ? ? ?

## 2021-06-06 NOTE — Anesthesia Preprocedure Evaluation (Signed)
Anesthesia Evaluation  ?Patient identified by MRN, date of birth, ID band ?Patient awake ? ? ? ?Reviewed: ?Allergy & Precautions, H&P , NPO status , Patient's Chart, lab work & pertinent test results, reviewed documented beta blocker date and time  ? ?History of Anesthesia Complications ?(+) PONV and history of anesthetic complications ? ?Airway ?Mallampati: II ? ?TM Distance: >3 FB ?Neck ROM: full ? ? ? Dental ?no notable dental hx. ? ?  ?Pulmonary ?neg pulmonary ROS,  ?  ?Pulmonary exam normal ?breath sounds clear to auscultation ? ? ? ? ? ? Cardiovascular ?Exercise Tolerance: Good ?negative cardio ROS ? ? ?Rhythm:regular Rate:Normal ? ? ?  ?Neuro/Psych ?negative neurological ROS ? negative psych ROS  ? GI/Hepatic ?negative GI ROS, Neg liver ROS, H/o gastritis ?  ?Endo/Other  ?negative endocrine ROS ? Renal/GU ?negative Renal ROS  ?negative genitourinary ?  ?Musculoskeletal ? ? Abdominal ?  ?Peds ? Hematology ?negative hematology ROS ?(+)   ?Anesthesia Other Findings ?Breast ca ?Adrenal tumor ? Reproductive/Obstetrics ?negative OB ROS ? ?  ? ? ? ? ? ? ? ? ? ? ? ? ? ?  ?  ? ? ? ? ? ? ? ? ?Anesthesia Physical ?Anesthesia Plan ? ?ASA: 2 ? ?Anesthesia Plan: General  ? ?Post-op Pain Management:   ? ?Induction:  ? ?PONV Risk Score and Plan: 4 or greater and Propofol infusion, TIVA and Treatment may vary due to age or medical condition ? ?Airway Management Planned:  ? ?Additional Equipment:  ? ?Intra-op Plan:  ? ?Post-operative Plan:  ? ?Informed Consent: I have reviewed the patients History and Physical, chart, labs and discussed the procedure including the risks, benefits and alternatives for the proposed anesthesia with the patient or authorized representative who has indicated his/her understanding and acceptance.  ? ? ? ?Dental Advisory Given ? ?Plan Discussed with: CRNA ? ?Anesthesia Plan Comments:   ? ? ? ? ? ? ?Anesthesia Quick Evaluation ? ?

## 2021-06-06 NOTE — Anesthesia Procedure Notes (Signed)
Date/Time: 06/06/2021 9:57 AM ?Performed by: Mayme Genta, CRNA ?Pre-anesthesia Checklist: Patient identified, Emergency Drugs available, Suction available, Timeout performed and Patient being monitored ?Patient Re-evaluated:Patient Re-evaluated prior to induction ?Oxygen Delivery Method: Nasal cannula ?Placement Confirmation: positive ETCO2 ? ? ? ? ?

## 2021-06-06 NOTE — H&P (Signed)
? ?[name], MD [facility] ?[address] ?[place], [place] [address] ?Phone: [phone] ?Fax : [phone] ? ?Primary Care Physician:  Crecencio Mc, MD ?Primary Gastroenterologist:  Dr. Allen Norris ? ?Pre-Procedure History & Physical: ?HPI:  Robin Riley is a 46 y.o. female is here for a screening colonoscopy.  ? ?Past Medical History:  ?Diagnosis Date  ? Acne   ? managed by Nehemiah Massed  ? Adrenal tumor 2008  ? found during workup for hypertension  ? Breast cancer ([place])   ? Cancer [facility])   ? Chest pain 2008  ? negative myoview, attributed to costochondritis  ? Gastritis Sept 2008  ? mild, EGD Darreh Allen Norris   ? History of dysplastic nevus 12/15/2017  ? Right sup. lat. buttocks. Moderate atypia, limited margins free.  ? L4 vertebral fracture ([place])   ? remote  ? ? ?Past Surgical History:  ?Procedure Laterality Date  ? AUGMENTATION MAMMAPLASTY    ? BREAST LUMPECTOMY WITH RADIOACTIVE SEED LOCALIZATION Left 10/04/2014  ? Procedure: LEFT BREAST LUMPECTOMY WITH RADIOACTIVE SEED LOCALIZATION;  Surgeon: Autumn Messing III, MD;  Location: [place];  Service: General;  Laterality: Left;  ? BREAST RECONSTRUCTION WITH PLACEMENT OF TISSUE EXPANDER AND FLEX HD (ACELLULAR HYDRATED DERMIS) Left 06/03/2017  ? Procedure: LEFT BREAST RECONSTRUCTION WITH PLACEMENT OF TISSUE EXPANDER AND FLEX HD (ACELLULAR HYDRATED DERMIS);  Surgeon: Wallace Going, DO;  Location: [place];  Service: Plastics;  Laterality: Left;  ? ENDOMETRIAL ABLATION  2012  ? MASTECTOMY    ? NIPPLE SPARING MASTECTOMY Left 06/03/2017  ? Procedure: LEFT BREAST NIPPLE SPARING MASTECTOMY;  Surgeon: Jovita Kussmaul, MD;  Location: [place];  Service: General;  Laterality: Left;  ? PLACEMENT OF BREAST IMPLANTS Right 09/01/2017  ? Procedure: PLACEMENT OF BREAST IMPLANT FOR SYMMETRY;  Surgeon: Wallace Going, DO;  Location: [place];  Service: Plastics;  Laterality: Right;  ? REMOVAL OF TISSUE EXPANDER AND PLACEMENT OF IMPLANT Left 09/01/2017  ?  Procedure: REMOVAL OF LEFT TISSUE EXPANDER WITH PLACEMENT OF LEFT BREAST IMPLANT;  Surgeon: Wallace Going, DO;  Location: [place];  Service: Plastics;  Laterality: Left;  ? TONSILLECTOMY    ? ? ?Prior to Admission medications   ?Medication Sig Start Date End Date Taking? Authorizing Provider  ?calcium citrate-vitamin D 200-200 MG-UNIT TABS Take 1 tablet by mouth daily.   Yes [provider]  ?cyanocobalamin (,VITAMIN B-12,) 1000 MCG/ML injection INJECT 1 ML (1,000 MCG TOTAL) INTO THE MUSCLE EVERY 30 DAYS 05/30/21 05/30/22 Yes Crecencio Mc, MD  ?vitamin C (ASCORBIC ACID) 500 MG tablet Take 500 mg by mouth daily.   Yes [provider]  ?tretinoin (RETIN-A) 0.025 % cream APPLY TOPICALLY TO AFFECTED AREA(S) AT BEDTIME 03/18/21   Crecencio Mc, MD  ? ? ?Allergies as of 05/26/2021 - Review Complete 05/26/2021  ?Allergen Reaction Noted  ? Sulfa antibiotics Rash 01/06/2011  ? ? ?Family History  ?Problem Relation Age of Onset  ? Osteoporosis Mother   ? Hypertension Mother   ? Heart disease Maternal Grandfather   ? Osteoporosis Maternal Grandmother   ? Diabetes Maternal Grandmother   ? Colon cancer Maternal Grandmother 95  ? Cancer Maternal Aunt   ?     ovarian  ? ? ?Social History  ? ?Socioeconomic History  ? Marital status: Married  ?  Spouse name: Not on file  ? Number of children: Not on file  ? Years of education: Not on file  ?  Highest education level: Not on file  ?Occupational History  ? Not on file  ?Tobacco Use  ? Smoking status: Never  ? Smokeless tobacco: Never  ?Substance and Sexual Activity  ? Alcohol use: No  ? Drug use: No  ? Sexual activity: Not on file  ?  Comment: endometrial ablation  ?Other Topics Concern  ? Not on file  ?Social History Narrative  ? Not on file  ? ?Social Determinants of Health  ? ?Financial Resource Strain: Not on file  ?Food Insecurity: Not on file  ?Transportation Needs: Not on file  ?Physical Activity: Not on file  ?Stress: Not on file   ?Social Connections: Not on file  ?Intimate Partner Violence: Not on file  ? ? ?Review of Systems: ?See HPI, otherwise negative ROS ? ?Physical Exam: ?There were no vitals taken for this visit. ?General:   Alert,  pleasant and cooperative in NAD ?Head:  Normocephalic and atraumatic. ?Neck:  Supple; no masses or thyromegaly. ?Lungs:  Clear throughout to auscultation.    ?Heart:  Regular rate and rhythm. ?Abdomen:  Soft, nontender and nondistended. Normal bowel sounds, without guarding, and without rebound.   ?Neurologic:  Alert and  oriented x4;  grossly normal neurologically. ? ?Impression/Plan: ?Robin Riley is now here to undergo a screening colonoscopy. ? ?Risks, benefits, and alternatives regarding colonoscopy have been reviewed with the patient.  Questions have been answered.  All parties agreeable. ?

## 2021-06-06 NOTE — Transfer of Care (Signed)
Immediate Anesthesia Transfer of Care Note ? ?Patient: Robin Riley ? ?Procedure(s) Performed: COLONOSCOPY WITH PROPOFOL (Rectum) ?POLYPECTOMY (Rectum) ? ?Patient Location: PACU ? ?Anesthesia Type: General ? ?Level of Consciousness: awake, alert  and patient cooperative ? ?Airway and Oxygen Therapy: Patient Spontanous Breathing and Patient connected to supplemental oxygen ? ?Post-op Assessment: Post-op Vital signs reviewed, Patient's Cardiovascular Status Stable, Respiratory Function Stable, Patent Airway and No signs of Nausea or vomiting ? ?Post-op Vital Signs: Reviewed and stable ? ?Complications: No notable events documented. ? ?

## 2021-06-10 ENCOUNTER — Encounter: Payer: Self-pay | Admitting: Gastroenterology

## 2021-06-10 DIAGNOSIS — D126 Benign neoplasm of colon, unspecified: Secondary | ICD-10-CM

## 2021-06-10 LAB — SURGICAL PATHOLOGY

## 2021-06-18 ENCOUNTER — Telehealth: Payer: Self-pay

## 2021-06-18 NOTE — Telephone Encounter (Signed)
Called patient, [place] denied reauthorization for release of medial capsule contracture. Photographs showed reconstructed breast are reasonably symmetrical. Offered to provide cosmetic quote ?

## 2021-07-14 ENCOUNTER — Ambulatory Visit
Admission: RE | Admit: 2021-07-14 | Discharge: 2021-07-14 | Disposition: A | Discharge status: Home / Self Care | Payer: No Typology Code available for payment source | Source: Ambulatory Visit | Attending: Plastic Surgery | Admitting: Plastic Surgery

## 2021-07-14 DIAGNOSIS — Z1231 Encounter for screening mammogram for malignant neoplasm of breast: Secondary | ICD-10-CM

## 2021-07-17 ENCOUNTER — Telehealth: Payer: Self-pay | Admitting: *Deleted

## 2021-07-17 NOTE — Telephone Encounter (Signed)
-----   Message from Wallace Going, DO sent at 07/15/2021  4:51 PM EDT ----- Please let patient know it was negative ----- Message ----- From: Interface, Rad Results In Sent: 07/15/2021   2:40 PM EDT To: Loel Lofty Dillingham, DO

## 2021-07-17 NOTE — Telephone Encounter (Signed)
Called and spoke with the patient and informed her of her recent Mammogram results.   Patient verbalized understanding.//AB/CMA

## 2021-08-27 ENCOUNTER — Other Ambulatory Visit: Payer: Self-pay

## 2021-09-05 ENCOUNTER — Other Ambulatory Visit: Payer: Self-pay

## 2021-10-06 ENCOUNTER — Encounter: Payer: Self-pay | Admitting: Internal Medicine

## 2021-10-07 ENCOUNTER — Other Ambulatory Visit: Payer: Self-pay | Admitting: Internal Medicine

## 2021-10-07 MED ORDER — LIDOCAINE VISCOUS HCL 2 % MT SOLN
15.0000 mL | Freq: Four times a day (QID) | OROMUCOSAL | 0 refills | Status: DC | PRN
  Filled 2021-10-07: qty 20, 1d supply, fill #0

## 2021-10-08 ENCOUNTER — Other Ambulatory Visit: Payer: Self-pay

## 2021-11-14 ENCOUNTER — Ambulatory Visit: Payer: 59 | Admitting: Surgical

## 2021-11-14 ENCOUNTER — Ambulatory Visit: Payer: 59 | Admitting: Plastic Surgery

## 2021-12-08 IMAGING — MG DIGITAL SCREENING UNILAT RIGHT IMPLANT  W/ TOMO W/ CAD
8 series · 9 of 20 positions shown · non-contrast
Comparison: Previous exam(s).

CLINICAL DATA: Screening.

EXAM:
DIGITAL SCREENING UNILATERAL RIGHT MAMMOGRAM WITH IMPLANTS, CAD AND
TOMOSYNTHESIS
TECHNIQUE: Right screening digital craniocaudal and mediolateral oblique
mammograms were obtained. Right screening digital breast
tomosynthesis was performed. The images were evaluated with
computer-aided detection. Standard and/or implant displaced views
were performed.

[R CC]
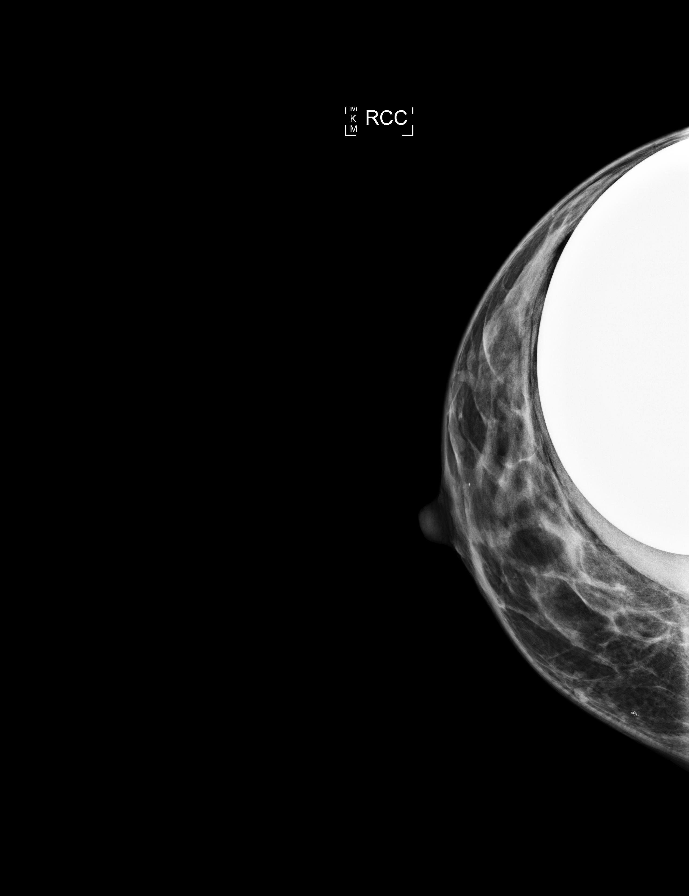

[R MLO]
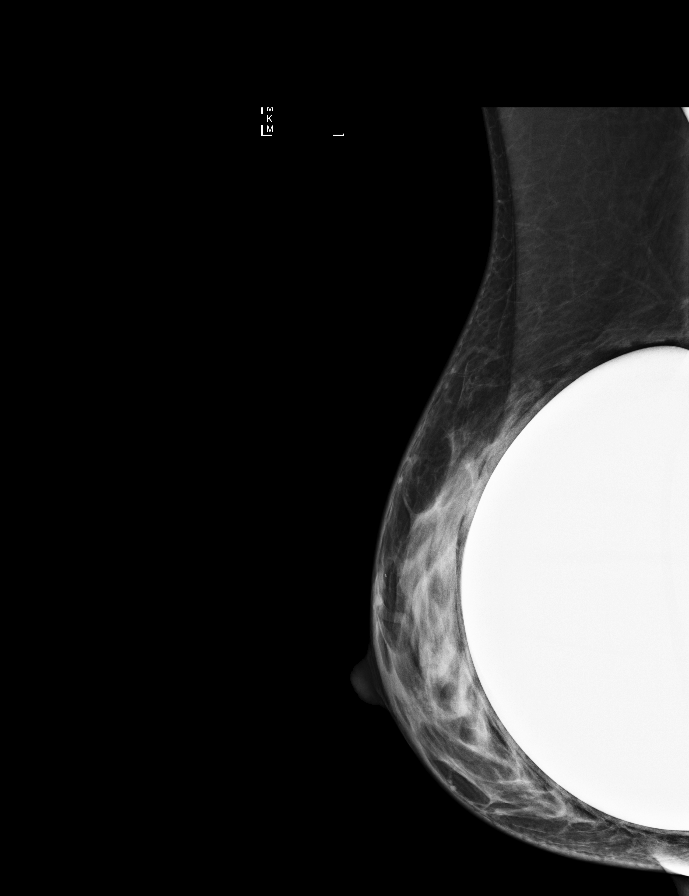

[R MLO synth-2D (1 of 2)]
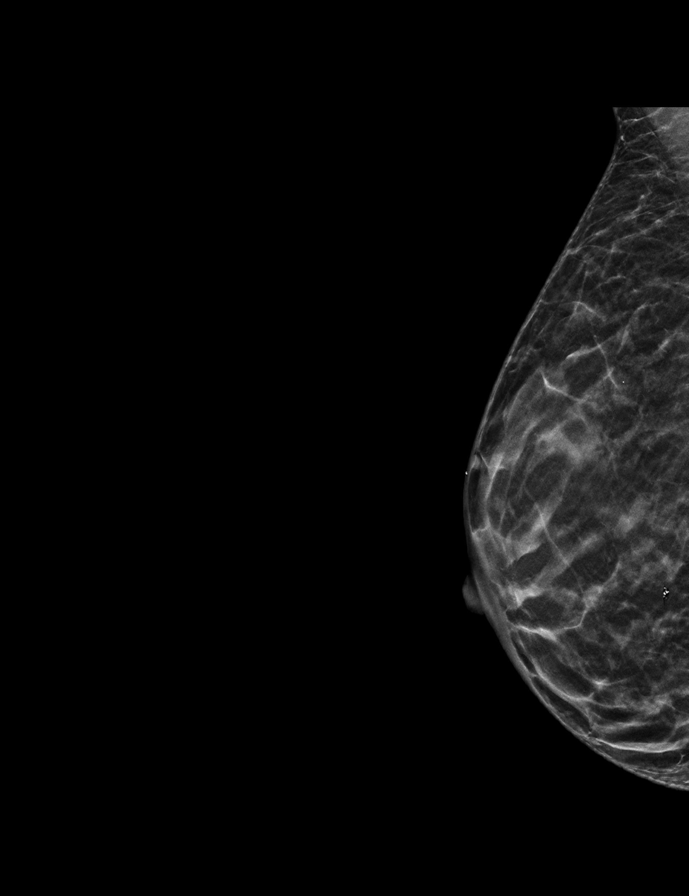

[R CC synth-2D]
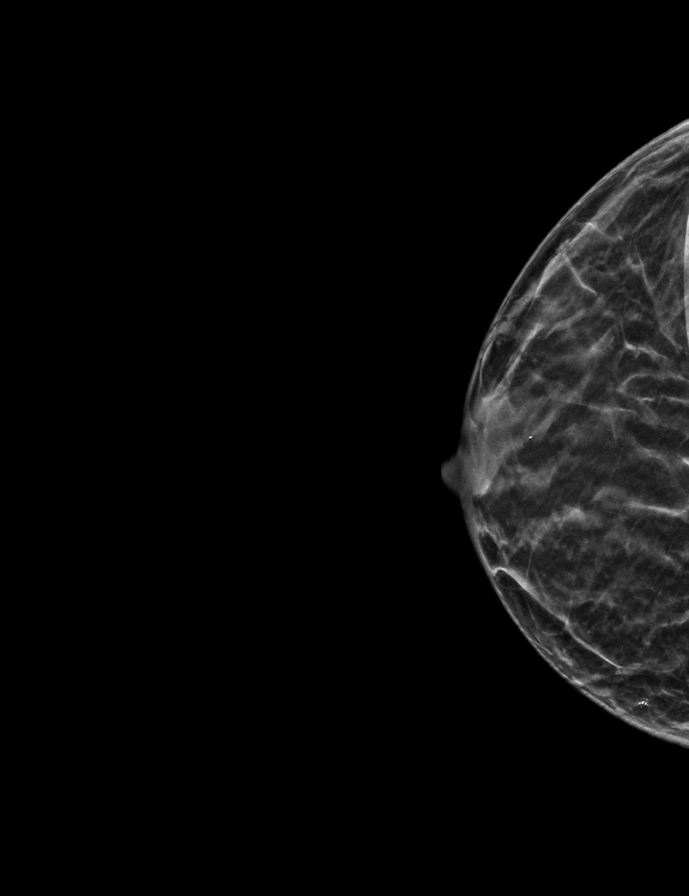

[R MLO synth-2D (2 of 2)]
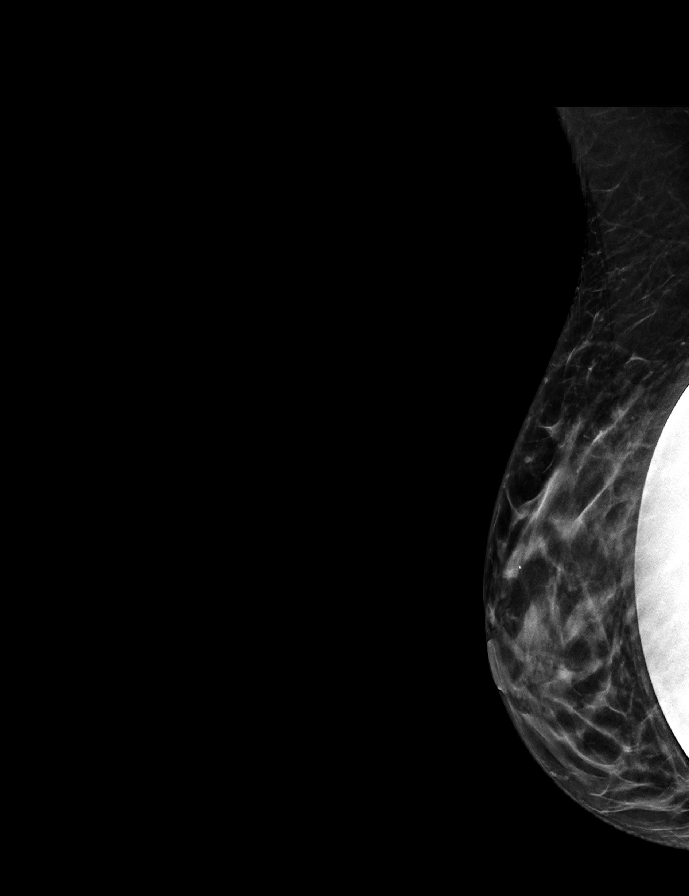

[R MLOID BREAST TOMOSYNTHESIS IMAGE tomo · 2 of 44 frames shown (1 of 2)]
[frame 15/44]
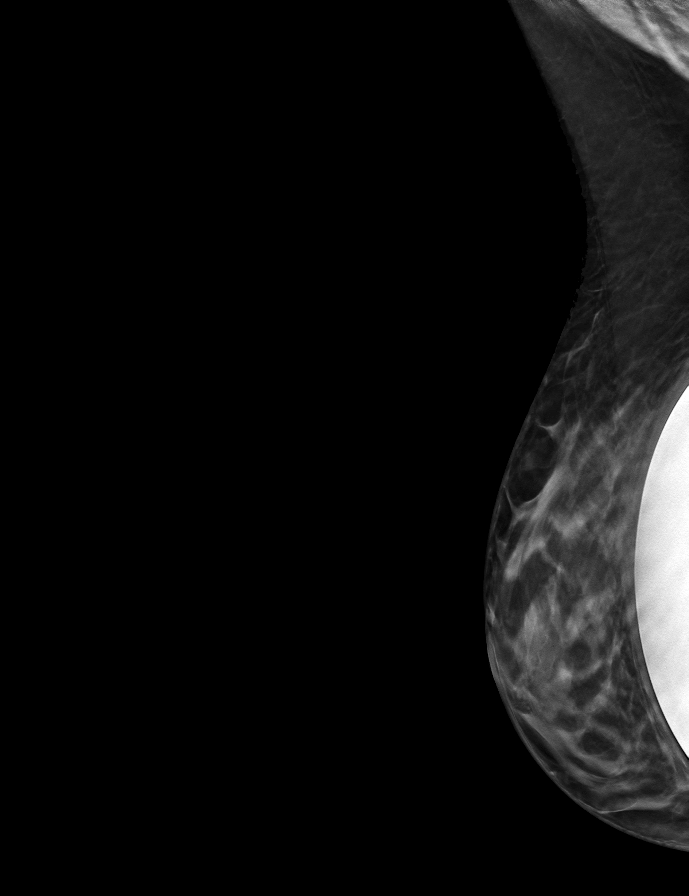
[frame 23/44]
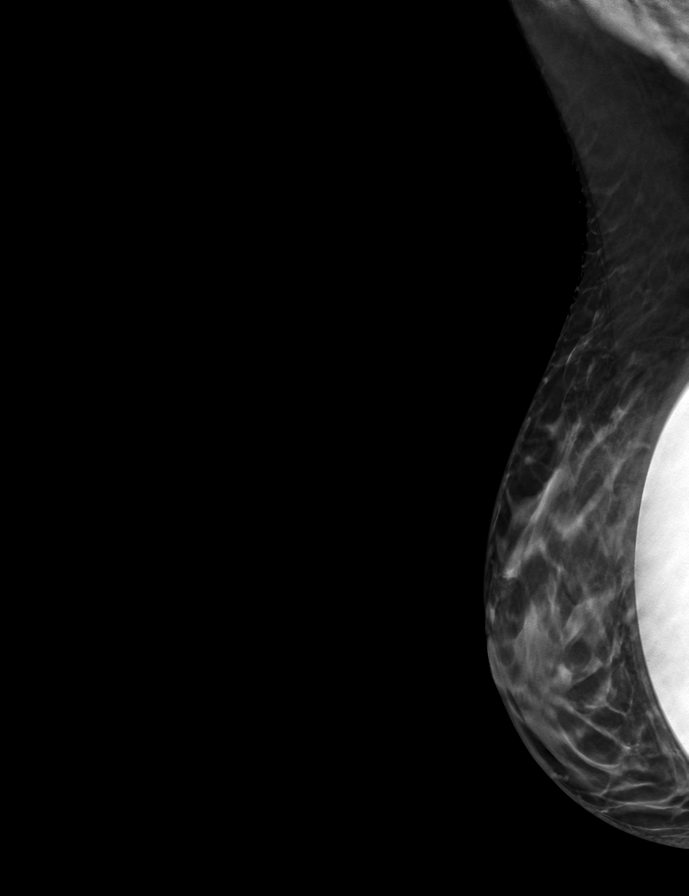

[R MLOID BREAST TOMOSYNTHESIS IMAGE tomo (2 of 2) · tomo slice 15/29.0]
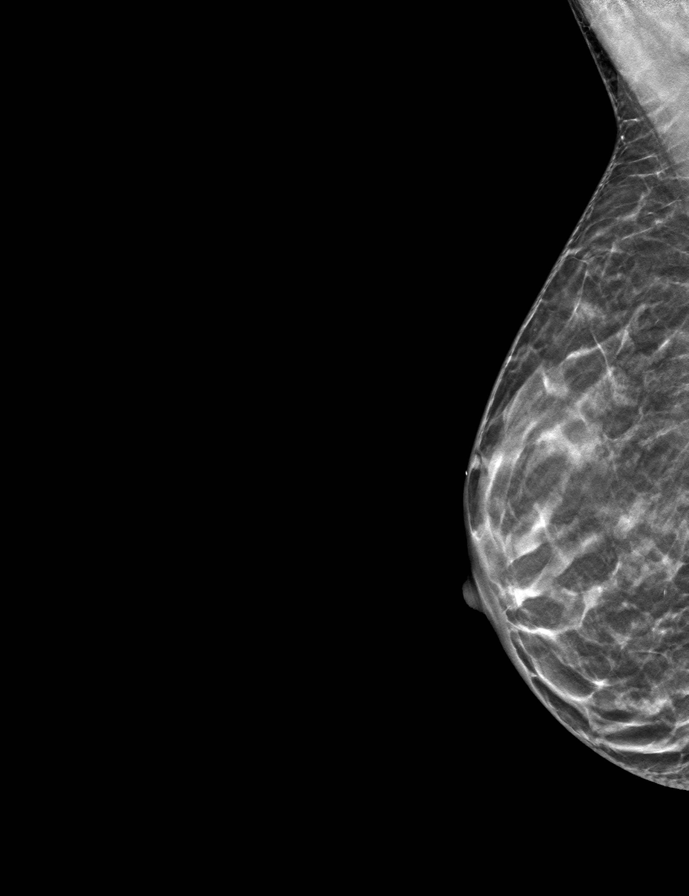

[R CCID BREAST TOMOSYNTHESIS IMAGE tomo · tomo slice 16/31.0]
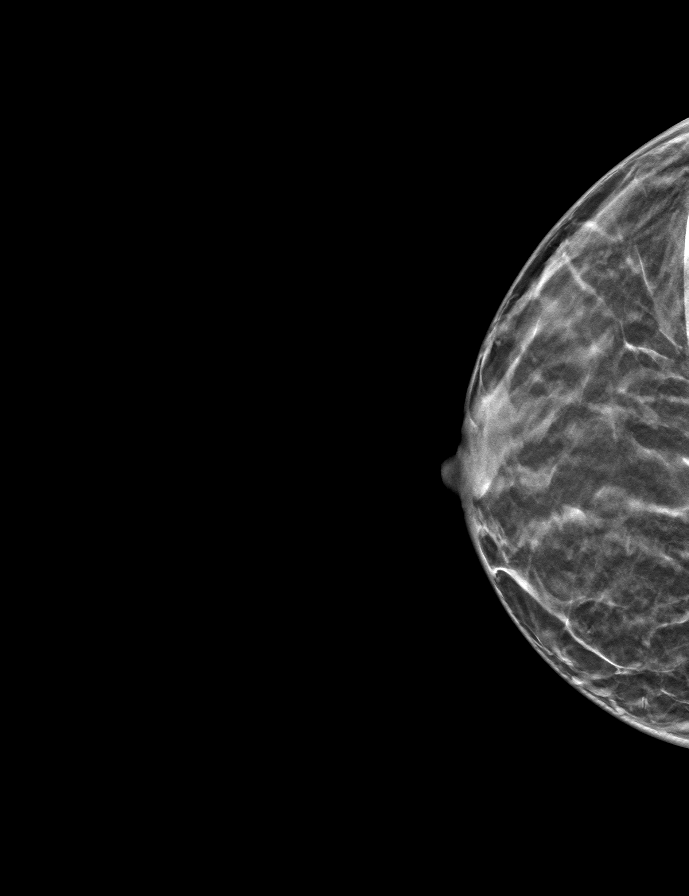

[9 of 20 positions shown; findings below may reference images not displayed]

ACR Breast Density Category c: The breast tissue is heterogeneously
dense, which may obscure small masses.
FINDINGS: The patient has a retropectoral implant. The patient has had a left
mastectomy. There are no findings suspicious for malignancy.
IMPRESSION: No mammographic evidence of malignancy. A result letter of this
screening mammogram will be mailed directly to the patient.

RECOMMENDATION:
Screening mammogram in one year.  (Code:MG-M-1VG)

BI-RADS CATEGORY  1.  Negative

## 2021-12-16 ENCOUNTER — Encounter: Payer: Self-pay | Admitting: Plastic Surgery

## 2021-12-16 ENCOUNTER — Ambulatory Visit: Payer: No Typology Code available for payment source | Admitting: Plastic Surgery

## 2021-12-16 DIAGNOSIS — N651 Disproportion of reconstructed breast: Secondary | ICD-10-CM | POA: Diagnosis not present

## 2021-12-16 DIAGNOSIS — Z9889 Other specified postprocedural states: Secondary | ICD-10-CM

## 2021-12-16 DIAGNOSIS — Z9012 Acquired absence of left breast and nipple: Secondary | ICD-10-CM

## 2021-12-16 NOTE — Progress Notes (Signed)
   Subjective:    Patient ID: Robin Riley, female    DOB: [birth year], 46 y.o.   MRN: [id number]  The patient is a 46 year old female joining me for further discussion about her breast reconstruction.  She had a partial mastectomy of her left breast in 2017 followed by complete mastectomy 2 years later.  She then had a expander and implant placed so she has a Mentor smooth round high-profile gel 325 cc expander on the left and then for symmetry she had a 250 cc Mentor smooth round high-profile gel implant placed on the right.  She has some loss of volume and contour on the left medial aspect there is rippling.  She would like to see if there is anything that could be done to improve this.      Review of Systems  Constitutional: Negative.   Eyes: Negative.   Respiratory: Negative.  Negative for chest tightness.   Cardiovascular: Negative.   Gastrointestinal: Negative.   Endocrine: Negative.   Genitourinary: Negative.   Musculoskeletal: Negative.        Objective:   Physical Exam Constitutional:      Appearance: Normal appearance.  HENT:     Head: Normocephalic.  Cardiovascular:     Rate and Rhythm: Normal rate.     Pulses: Normal pulses.  Pulmonary:     Effort: Pulmonary effort is normal.  Abdominal:     Palpations: Abdomen is soft.  Musculoskeletal:        General: No swelling or deformity.  Skin:    General: Skin is warm.     Capillary Refill: Capillary refill takes less than 2 seconds.     Coloration: Skin is not jaundiced.     Findings: No bruising.  Neurological:     Mental Status: She is alert and oriented to person, place, and time.  Psychiatric:        Mood and Affect: Mood normal.        Behavior: Behavior normal.        Assessment & Plan:     ICD-10-CM   1. H/O mastectomy, left  Z90.12     2. History of augmentation of right breast  Z98.890     3. History of reconstruction of left breast  Z98.890       Pictures were obtained of the patient  and placed in the chart with the patient's or guardian's permission.  The patient is a good candidate for revisional surgery for the left breast with fat grafting.  She does not have a whole lot of extra fat so we would have to use lateral thigh as well as abdomen.

## 2022-02-18 ENCOUNTER — Encounter: Payer: Self-pay | Admitting: Internal Medicine

## 2022-03-01 ENCOUNTER — Other Ambulatory Visit: Payer: Self-pay

## 2022-03-07 ENCOUNTER — Telehealth: Payer: Self-pay | Admitting: *Deleted

## 2022-03-07 NOTE — Telephone Encounter (Signed)
Auth submitted to new [employer] plan for [id number], [id number], [id number]  [id number] approved 03/16/22 - 09/05/22

## 2022-03-10 ENCOUNTER — Telehealth: Payer: Self-pay | Admitting: *Deleted

## 2022-03-10 NOTE — Telephone Encounter (Signed)
Spoke to patient to schedule sx and related appointments

## 2022-03-10 NOTE — Telephone Encounter (Signed)
Spoke with patient, offered 03/25/22 or Feb 2024. She wants to discuss dates with spouse and will call back. She is aware that other patients may take the 24th as I cannot hold it for her. Will wait to hear back from patient.

## 2022-03-11 ENCOUNTER — Telehealth: Payer: Self-pay | Admitting: Plastic Surgery

## 2022-03-11 NOTE — Telephone Encounter (Signed)
Patient wanted to know if she would need the 2 weeks off work. I informed with just fat grafting the procedure wouldn't be as in depth as the mastectomies, but the lipo does make some patients really sore. She has her pre-op appointment 2/2 and will ask more at that point as far as time it would take for the surgery etc.

## 2022-03-11 NOTE — Telephone Encounter (Signed)
Pt called has sx 2.22 w Dr. Keturah Barre and she is asking about recovery time and sx time, she is trying to make sure she takes off enough time from work. Pt would like a call back if possible

## 2022-03-26 ENCOUNTER — Encounter: Payer: Self-pay | Admitting: Internal Medicine

## 2022-03-30 ENCOUNTER — Telehealth (INDEPENDENT_AMBULATORY_CARE_PROVIDER_SITE_OTHER): Payer: 59 | Admitting: Internal Medicine

## 2022-03-30 ENCOUNTER — Encounter: Payer: Self-pay | Admitting: Internal Medicine

## 2022-03-30 VITALS — Ht 63.0 in | Wt 126.0 lb

## 2022-03-30 DIAGNOSIS — B351 Tinea unguium: Secondary | ICD-10-CM | POA: Insufficient documentation

## 2022-03-30 NOTE — Progress Notes (Unsigned)
Virtual Visit via [place]   Note   This format is felt to be most appropriate for this patient at this time.  All issues noted in this document were discussed and addressed.  No physical exam was performed (except for noted visual exam findings with Video Visits).   I connected with Robin Riley on 03/30/22 at  4:30 PM EST by a video enabled telemedicine application  and verified that I am speaking with the correct person using two identifiers. Location patient: home Location provider: work or home office Persons participating in the virtual visit: patient, provider  I discussed the limitations, risks, security and privacy concerns of performing an evaluation and management service by telephone and the availability of in person appointments. I also discussed with the patient that there may be a patient responsible charge related to this service. The patient expressed understanding and agreed to proceed.  Reason for visit: discoloration of thumbnail  HPI:  47 yr old female presents with thickened yellow nail involving the distal end of  right thumb.  Wore artificial nails for 2 years. ("S & S"?)   Developed sore thumb and peeling skin about 1 month ago.  Removed artifical nail 1.5 weeks ago .  Peeling skin has resolved.     ROS: See pertinent positives and negatives per HPI.  Past Medical History:  Diagnosis Date   Acne    managed by Nehemiah Massed   Adrenal tumor 2008   found during workup for hypertension   Breast cancer ([place])    Cancer ([place])    Chest pain 2008   negative myoview, attributed to costochondritis   Gastritis Sept 2008   mild, EGD Darreh Allen Norris    History of dysplastic nevus 12/15/2017   Right sup. lat. buttocks. Moderate atypia, limited margins free.   L4 vertebral fracture (HCC)    remote    Past Surgical History:  Procedure Laterality Date   AUGMENTATION MAMMAPLASTY     BREAST LUMPECTOMY WITH RADIOACTIVE SEED LOCALIZATION Left 10/04/2014   Procedure: LEFT BREAST  LUMPECTOMY WITH RADIOACTIVE SEED LOCALIZATION;  Surgeon: Autumn Messing III, MD;  Location: [facility];  Service: General;  Laterality: Left;   BREAST RECONSTRUCTION WITH PLACEMENT OF TISSUE EXPANDER AND FLEX HD (ACELLULAR HYDRATED DERMIS) Left 06/03/2017   Procedure: LEFT BREAST RECONSTRUCTION WITH PLACEMENT OF TISSUE EXPANDER AND FLEX HD (ACELLULAR HYDRATED DERMIS);  Surgeon: Wallace Going, DO;  Location: [facility];  Service: Plastics;  Laterality: Left;   COLONOSCOPY WITH PROPOFOL N/A 06/06/2021   Procedure: COLONOSCOPY WITH PROPOFOL;  Surgeon: [name], MD;  Location: [facility];  Service: Endoscopy;  Laterality: N/A;   ENDOMETRIAL ABLATION  2012   MASTECTOMY     NIPPLE SPARING MASTECTOMY Left 06/03/2017   Procedure: LEFT BREAST NIPPLE SPARING MASTECTOMY;  Surgeon: Jovita Kussmaul, MD;  Location: [facility];  Service: General;  Laterality: Left;   PLACEMENT OF BREAST IMPLANTS Right 09/01/2017   Procedure: PLACEMENT OF BREAST IMPLANT FOR SYMMETRY;  Surgeon: Wallace Going, DO;  Location: [facility] [place];  Service: Plastics;  Laterality: Right;   POLYPECTOMY  06/06/2021   Procedure: POLYPECTOMY;  Surgeon: [name], MD;  Location: [facility];  Service: Endoscopy;;   REMOVAL OF TISSUE EXPANDER AND PLACEMENT OF IMPLANT Left 09/01/2017   Procedure: REMOVAL OF LEFT TISSUE EXPANDER WITH PLACEMENT OF LEFT BREAST IMPLANT;  Surgeon: Wallace Going, DO;  Location: [facility];  Service: Plastics;  Laterality: Left;   TONSILLECTOMY  Family History  Problem Relation Age of Onset   Osteoporosis Mother    Hypertension Mother    Heart disease Maternal Grandfather    Osteoporosis Maternal Grandmother    Diabetes Maternal Grandmother    Colon cancer Maternal Grandmother 95   Cancer Maternal Aunt        ovarian    SOCIAL HX: ***   Current Outpatient Medications:    calcium citrate-vitamin D 200-200 MG-UNIT TABS, Take 1 tablet by mouth  daily., Disp: , Rfl:    cyanocobalamin (,VITAMIN B-12,) 1000 MCG/ML injection, INJECT 1 ML (1,000 MCG TOTAL) INTO THE MUSCLE EVERY 30 DAYS, Disp: 10 mL, Rfl: 1   tretinoin (RETIN-A) 0.025 % cream, APPLY TOPICALLY TO AFFECTED AREA(S) AT BEDTIME, Disp: 20 g, Rfl: 1   vitamin C (ASCORBIC ACID) 500 MG tablet, Take 500 mg by mouth daily., Disp: , Rfl:    lidocaine (XYLOCAINE) 2 % solution, Use as directed 15 mLs in the mouth or throat every 6 (six) hours as needed for mouth pain. (Patient not taking: Reported on 03/30/2022), Disp: 20 mL, Rfl: 0  EXAM:  VITALS per patient if applicable:  GENERAL: alert, oriented, appears well and in no acute distress  HEENT: atraumatic, conjunttiva clear, no obvious abnormalities on inspection of external nose and ears  NECK: normal movements of the head and neck  LUNGS: on inspection no signs of respiratory distress, breathing rate appears normal, no obvious gross SOB, gasping or wheezing  CV: no obvious cyanosis  MS: moves all visible extremities without noticeable abnormality  PSYCH/NEURO: pleasant and cooperative, no obvious depression or anxiety, speech and thought processing grossly intact  ASSESSMENT AND PLAN: Onychomycosis -     Comprehensive metabolic panel; Future -     Comprehensive metabolic panel; Future      I discussed the assessment and treatment plan with the patient. The patient was provided an opportunity to ask questions and all were answered. The patient agreed with the plan and demonstrated an understanding of the instructions.   The patient was advised to call back or seek an in-person evaluation if the symptoms worsen or if the condition fails to improve as anticipated.   I spent 30 minutes dedicated to the care of this patient on the date of this encounter to include pre-visit review of his medical history,  Face-to-face time with the patient , and post visit ordering of testing and therapeutics.    Crecencio Mc, MD

## 2022-03-30 NOTE — Assessment & Plan Note (Signed)
Right thumbnail,  likely due to prlonged wearing of artificial nails.  Will rx with terbinafine 250 mg daily x 6 weeks but needs pre treatment LFTs  which have been ordered

## 2022-03-31 ENCOUNTER — Encounter: Payer: Self-pay | Admitting: Internal Medicine

## 2022-03-31 ENCOUNTER — Other Ambulatory Visit: Payer: Self-pay

## 2022-03-31 MED ORDER — CICLOPIROX 8 % EX SOLN
CUTANEOUS | 1 refills | Status: DC
  Filled 2022-03-31: qty 6.6, 14d supply, fill #0

## 2022-04-03 ENCOUNTER — Encounter: Payer: Self-pay | Admitting: Surgical

## 2022-04-03 ENCOUNTER — Ambulatory Visit: Payer: 59 | Admitting: Surgical

## 2022-04-03 DIAGNOSIS — Z9012 Acquired absence of left breast and nipple: Secondary | ICD-10-CM

## 2022-04-03 DIAGNOSIS — N651 Disproportion of reconstructed breast: Secondary | ICD-10-CM

## 2022-04-03 DIAGNOSIS — Z9889 Other specified postprocedural states: Secondary | ICD-10-CM

## 2022-04-03 NOTE — Progress Notes (Signed)
   Patient ID: Robin Riley, female    DOB: 10/03/1975, 47 y.o.   MRN: [id number]  Chief Complaint  Patient presents with   Pre-op Exam      ICD-10-CM   1. H/O mastectomy, left  Z90.12     2. History of reconstruction of left breast  Z98.890     3. Acquired absence of left breast  Z90.12     4. Breast asymmetry following reconstructive surgery  N65.1        History of Present Illness: Robin Riley is a 47 y.o.  female  with a history of right breast augmentation and left breast mastectomy with reconstruction.  She presents for preoperative evaluation for upcoming procedure, fat grafting to left breast, liposuction of abdomen, scheduled for 04/23/2022 with Dr. Dillingham.  The patient has not had problems with anesthesia. No history of DVT/PE.  No family history of DVT/PE.  No family or personal history of bleeding or clotting disorders.  Patient is not currently taking any blood thinners.  No history of CVA/MI.   Summary of Previous Visit: Patient had postmastectomy of her left breast in 2017 followed by complete mastectomy 2 years later.  She had expander and implant placed, subsequently had a smooth round high-profile gel 325 cc implant placed on the left, 250 cc Mentor smooth round high-profile implant placed on the right for symmetry.  She has volume loss and rippling of the left breast and would like some improvement.  Job: Office staff, front desk,  [employer] Significant for: Left breast reconstruction, implant in place.  Right breast augmentation for symmetry.  Patient is feeling well, no recent changes to her health.   Past Medical History: Allergies: Allergies  Allergen Reactions   Sulfa Antibiotics Rash    Current Medications:  Current Outpatient Medications:    calcium citrate-vitamin D 200-200 MG-UNIT TABS, Take 1 tablet by mouth daily., Disp: , Rfl:    ciclopirox (PENLAC) 8 % solution, Apply twice daily to affected nail, Disp: 6.6 mL, Rfl: 1    cyanocobalamin (,VITAMIN B-12,) 1000 MCG/ML injection, INJECT 1 ML (1,000 MCG TOTAL) INTO THE MUSCLE EVERY 30 DAYS, Disp: 10 mL, Rfl: 1   tretinoin (RETIN-A) 0.025 % cream, APPLY TOPICALLY TO AFFECTED AREA(S) AT BEDTIME, Disp: 20 g, Rfl: 1   vitamin C (ASCORBIC ACID) 500 MG tablet, Take 500 mg by mouth daily., Disp: , Rfl:   Past Medical Problems: Past Medical History:  Diagnosis Date   Acne    managed by Kowalski   Adrenal tumor 2008   found during workup for hypertension   Breast cancer ([facility])    Cancer ([facility])    Chest pain 2008   negative myoview, attributed to costochondritis   Gastritis Sept 2008   mild, EGD Darreh Wohl    History of dysplastic nevus 12/15/2017   Right sup. lat. buttocks. Moderate atypia, limited margins free.   L4 vertebral fracture (HCC)    remote    Past Surgical History: Past Surgical History:  Procedure Laterality Date   AUGMENTATION MAMMAPLASTY     BREAST LUMPECTOMY WITH RADIOACTIVE SEED LOCALIZATION Left 10/04/2014   Procedure: LEFT BREAST LUMPECTOMY WITH RADIOACTIVE SEED LOCALIZATION;  Surgeon: Paul Toth III, MD;  Location: [facility];  Service: General;  Laterality: Left;   BREAST RECONSTRUCTION WITH PLACEMENT OF TISSUE EXPANDER AND FLEX HD (ACELLULAR HYDRATED DERMIS) Left 06/03/2017   Procedure: LEFT BREAST RECONSTRUCTION WITH PLACEMENT OF TISSUE EXPANDER AND FLEX HD (ACELLULAR HYDRATED DERMIS);  Surgeon:   Dillingham, Claire S, DO;  [facility];  Service: Plastics;  Laterality: Left;   COLONOSCOPY WITH PROPOFOL N/A 06/06/2021   Procedure: COLONOSCOPY WITH PROPOFOL;  Surgeon: Wohl, Darren, MD;  Location: [facility];  Service: Endoscopy;  Laterality: N/A;   ENDOMETRIAL ABLATION  2012   MASTECTOMY     NIPPLE SPARING MASTECTOMY Left 06/03/2017   Procedure: LEFT BREAST NIPPLE SPARING MASTECTOMY;  Surgeon: Toth, Paul III, MD;  [facility];  Service: General;  Laterality: Left;   PLACEMENT OF BREAST IMPLANTS Right 09/01/2017    Procedure: PLACEMENT OF BREAST IMPLANT FOR SYMMETRY;  Surgeon: Dillingham, Claire S, DO;  Location: [facility];  Service: Plastics;  Laterality: Right;   POLYPECTOMY  06/06/2021   Procedure: POLYPECTOMY;  Surgeon: Wohl, Darren, MD;  Location: [facility];  Service: Endoscopy;;   REMOVAL OF TISSUE EXPANDER AND PLACEMENT OF IMPLANT Left 09/01/2017   Procedure: REMOVAL OF LEFT TISSUE EXPANDER WITH PLACEMENT OF LEFT BREAST IMPLANT;  Surgeon: Dillingham, Claire S, DO;  Location: [facility];  Service: Plastics;  Laterality: Left;   TONSILLECTOMY      Social History: Social History   Socioeconomic History   Marital status: Married    Spouse name: Not on file   Number of children: Not on file   Years of education: Not on file   Highest education level: Not on file  Occupational History   Not on file  Tobacco Use   Smoking status: Never   Smokeless tobacco: Never  Substance and Sexual Activity   Alcohol use: No   Drug use: No   Sexual activity: Not on file    Comment: endometrial ablation  Other Topics Concern   Not on file  Social History Narrative   Not on file   Social Determinants of Health   Financial Resource Strain: Not on file  Food Insecurity: Not on file  Transportation Needs: Not on file  Physical Activity: Not on file  Stress: Not on file  Social Connections: Not on file  Intimate Partner Violence: Not on file    Family History: Family History  Problem Relation Age of Onset   Osteoporosis Mother    Hypertension Mother    Heart disease Maternal Grandfather    Osteoporosis Maternal Grandmother    Diabetes Maternal Grandmother    Colon cancer Maternal Grandmother 95   Cancer Maternal Aunt        ovarian    Review of Systems: Review of Systems  Constitutional: Negative.   Respiratory: Negative.    Cardiovascular: Negative.   Neurological: Negative.     Physical Exam: Vital Signs There were no vitals taken for this  visit.  Physical Exam  Constitutional:      General: Not in acute distress.    Appearance: Normal appearance. Not ill-appearing.  HENT:     Head: Normocephalic and atraumatic.  Eyes:     Pupils: Pupils are equal, round Neck:     Musculoskeletal: Normal range of motion.  Cardiovascular:     Rate and Rhythm: Normal rate    Pulses: Normal pulses.  Pulmonary:     Effort: Pulmonary effort is normal. No respiratory distress.  Abdominal:     General: Abdomen is flat. There is no distension.  Musculoskeletal: Normal range of motion.  Skin:    General: Skin is warm and dry.     Findings: No erythema or rash.  Neurological:     General: No focal deficit present.       Mental Status: Alert and oriented to person, place, and time. Mental status is at baseline.     Motor: No weakness.  Psychiatric:        Mood and Affect: Mood normal.        Behavior: Behavior normal.    Assessment/Plan: The patient is scheduled for fat grafting to left breast and liposuction of abdomen with Dr. Dillingham.  Risks, benefits, and alternatives of procedure discussed, questions answered and consent obtained.    Smoking Status: Non-smoker; Counseling Given?  N/A Last Mammogram: Patient had mammogram of right breast, negative.;  06/2021  Caprini Score: 5, high; Risk Factors include: Age, history of cancer and length of planned surgery. Recommendation for mechanical prophylaxis. Encourage early ambulation.   Pictures obtained: @consult  Post-op Rx sent to pharmacy: Oxycodone, Zofran, Keflex  Patient was provided with the General Surgical Risk consent document and Pain Medication Agreement prior to their appointment.  They had adequate time to read through the risk consent documents and Pain Medication Agreement. We also discussed them in person together during this preop appointment. All of their questions were answered to their satisfaction.  Recommended calling if they have any further questions.  Risk  consent form and Pain Medication Agreement to be scanned into patient's chart.  The risks that can be encountered with and after liposuction were discussed and include the following but no limited to these:  Asymmetry, fluid accumulation, firmness of the area, fat necrosis with death of fat tissue, bleeding, infection, delayed healing, anesthesia risks, skin sensation changes, injury to structures including nerves, blood vessels, and muscles which may be temporary or permanent, allergies to tape, suture materials and glues, blood products, topical preparations or injected agents, skin and contour irregularities, skin discoloration and swelling, deep vein thrombosis, cardiac and pulmonary complications, pain, which may persist, persistent pain, recurrence of the lesion, poor healing of the incision, possible need for revisional surgery or staged procedures. Thiere can also be persistent swelling, poor wound healing, rippling or loose skin, worsening of cellulite, swelling, and thermal burn or heat injury from ultrasound with the ultrasound-assisted lipoplasty technique. Any change in weight fluctuations can alter the outcome.  We discussed risks of fat grafting include failure of the fat to take, need for additional procedures, bruising, swelling, pain from liposuction   Electronically signed by: Oleva Koo J Yazmina Pareja, PA-C 04/03/2022 3:32 PM  

## 2022-04-03 NOTE — H&P (View-Only) (Signed)
Patient ID: Robin Riley, female    DOB: Aug 16, 1975, 47 y.o.   MRN: [id number]  Chief Complaint  Patient presents with   Pre-op Exam      ICD-10-CM   1. H/O mastectomy, left  Z90.12     2. History of reconstruction of left breast  Z98.890     3. Acquired absence of left breast  Z90.12     4. Breast asymmetry following reconstructive surgery  N65.1        History of Present Illness: Robin Riley is a 47 y.o.  female  with a history of right breast augmentation and left breast mastectomy with reconstruction.  She presents for preoperative evaluation for upcoming procedure, fat grafting to left breast, liposuction of abdomen, scheduled for 04/23/2022 with Dr. Marla Roe.  The patient has not had problems with anesthesia. No history of DVT/PE.  No family history of DVT/PE.  No family or personal history of bleeding or clotting disorders.  Patient is not currently taking any blood thinners.  No history of CVA/MI.   Summary of Previous Visit: Patient had postmastectomy of her left breast in 2017 followed by complete mastectomy 2 years later.  She had expander and implant placed, subsequently had a smooth round high-profile gel 325 cc implant placed on the left, 250 cc Mentor smooth round high-profile implant placed on the right for symmetry.  She has volume loss and rippling of the left breast and would like some improvement.  Job: Mudlogger, front desk,  [employer] Significant for: Left breast reconstruction, implant in place.  Right breast augmentation for symmetry.  Patient is feeling well, no recent changes to her health.   Past Medical History: Allergies: Allergies  Allergen Reactions   Sulfa Antibiotics Rash    Current Medications:  Current Outpatient Medications:    calcium citrate-vitamin D 200-200 MG-UNIT TABS, Take 1 tablet by mouth daily., Disp: , Rfl:    ciclopirox (PENLAC) 8 % solution, Apply twice daily to affected nail, Disp: 6.6 mL, Rfl: 1    cyanocobalamin (,VITAMIN B-12,) 1000 MCG/ML injection, INJECT 1 ML (1,000 MCG TOTAL) INTO THE MUSCLE EVERY 30 DAYS, Disp: 10 mL, Rfl: 1   tretinoin (RETIN-A) 0.025 % cream, APPLY TOPICALLY TO AFFECTED AREA(S) AT BEDTIME, Disp: 20 g, Rfl: 1   vitamin C (ASCORBIC ACID) 500 MG tablet, Take 500 mg by mouth daily., Disp: , Rfl:   Past Medical Problems: Past Medical History:  Diagnosis Date   Acne    managed by Nehemiah Massed   Adrenal tumor 2008   found during workup for hypertension   Breast cancer ([place])    Cancer ([place])    Chest pain 2008   negative myoview, attributed to costochondritis   Gastritis Sept 2008   mild, EGD Darreh Allen Norris    History of dysplastic nevus 12/15/2017   Right sup. lat. buttocks. Moderate atypia, limited margins free.   L4 vertebral fracture ([facility])    remote    Past Surgical History: Past Surgical History:  Procedure Laterality Date   AUGMENTATION MAMMAPLASTY     BREAST LUMPECTOMY WITH RADIOACTIVE SEED LOCALIZATION Left 10/04/2014   Procedure: LEFT BREAST LUMPECTOMY WITH RADIOACTIVE SEED LOCALIZATION;  Surgeon: Autumn Messing III, MD;  Location: [facility];  Service: General;  Laterality: Left;   BREAST RECONSTRUCTION WITH PLACEMENT OF TISSUE EXPANDER AND FLEX HD (ACELLULAR HYDRATED DERMIS) Left 06/03/2017   Procedure: LEFT BREAST RECONSTRUCTION WITH PLACEMENT OF TISSUE EXPANDER AND FLEX HD (ACELLULAR HYDRATED DERMIS);  Surgeon:  Dillingham, Loel Lofty, DO;  Location: [facility];  Service: Plastics;  Laterality: Left;   COLONOSCOPY WITH PROPOFOL N/A 06/06/2021   Procedure: COLONOSCOPY WITH PROPOFOL;  Surgeon: [name], MD;  Location: [facility];  Service: Endoscopy;  Laterality: N/A;   ENDOMETRIAL ABLATION  2012   MASTECTOMY     NIPPLE SPARING MASTECTOMY Left 06/03/2017   Procedure: LEFT BREAST NIPPLE SPARING MASTECTOMY;  Surgeon: Jovita Kussmaul, MD;  Location: [facility];  Service: General;  Laterality: Left;   PLACEMENT OF BREAST IMPLANTS Right 09/01/2017    Procedure: PLACEMENT OF BREAST IMPLANT FOR SYMMETRY;  Surgeon: Wallace Going, DO;  Location: [facility];  Service: Plastics;  Laterality: Right;   POLYPECTOMY  06/06/2021   Procedure: POLYPECTOMY;  Surgeon: [name], MD;  Location: [facility];  Service: Endoscopy;;   REMOVAL OF TISSUE EXPANDER AND PLACEMENT OF IMPLANT Left 09/01/2017   Procedure: REMOVAL OF LEFT TISSUE EXPANDER WITH PLACEMENT OF LEFT BREAST IMPLANT;  Surgeon: Wallace Going, DO;  Location: [facility];  Service: Plastics;  Laterality: Left;   TONSILLECTOMY      Social History: Social History   Socioeconomic History   Marital status: Married    Spouse name: Not on file   Number of children: Not on file   Years of education: Not on file   Highest education level: Not on file  Occupational History   Not on file  Tobacco Use   Smoking status: Never   Smokeless tobacco: Never  Substance and Sexual Activity   Alcohol use: No   Drug use: No   Sexual activity: Not on file    Comment: endometrial ablation  Other Topics Concern   Not on file  Social History Narrative   Not on file   Social Determinants of Health   Financial Resource Strain: Not on file  Food Insecurity: Not on file  Transportation Needs: Not on file  Physical Activity: Not on file  Stress: Not on file  Social Connections: Not on file  Intimate Partner Violence: Not on file    Family History: Family History  Problem Relation Age of Onset   Osteoporosis Mother    Hypertension Mother    Heart disease Maternal Grandfather    Osteoporosis Maternal Grandmother    Diabetes Maternal Grandmother    Colon cancer Maternal Grandmother 95   Cancer Maternal Aunt        ovarian    Review of Systems: Review of Systems  Constitutional: Negative.   Respiratory: Negative.    Cardiovascular: Negative.   Neurological: Negative.     Physical Exam: Vital Signs There were no vitals taken for this  visit.  Physical Exam  Constitutional:      General: Not in acute distress.    Appearance: Normal appearance. Not ill-appearing.  HENT:     Head: Normocephalic and atraumatic.  Eyes:     Pupils: Pupils are equal, round Neck:     Musculoskeletal: Normal range of motion.  Cardiovascular:     Rate and Rhythm: Normal rate    Pulses: Normal pulses.  Pulmonary:     Effort: Pulmonary effort is normal. No respiratory distress.  Abdominal:     General: Abdomen is flat. There is no distension.  Musculoskeletal: Normal range of motion.  Skin:    General: Skin is warm and dry.     Findings: No erythema or rash.  Neurological:     General: No focal deficit present.  Mental Status: Alert and oriented to person, place, and time. Mental status is at baseline.     Motor: No weakness.  Psychiatric:        Mood and Affect: Mood normal.        Behavior: Behavior normal.    Assessment/Plan: The patient is scheduled for fat grafting to left breast and liposuction of abdomen with Dr. Marla Roe.  Risks, benefits, and alternatives of procedure discussed, questions answered and consent obtained.    Smoking Status: Non-smoker; Counseling Given?  N/A Last Mammogram: Patient had mammogram of right breast, negative.;  06/2021  Caprini Score: 5, high; Risk Factors include: Age, history of cancer and length of planned surgery. Recommendation for mechanical prophylaxis. Encourage early ambulation.   Pictures obtained: @consult$   Post-op Rx sent to pharmacy: Oxycodone, Zofran, Keflex  Patient was provided with the General Surgical Risk consent document and Pain Medication Agreement prior to their appointment.  They had adequate time to read through the risk consent documents and Pain Medication Agreement. We also discussed them in person together during this preop appointment. All of their questions were answered to their satisfaction.  Recommended calling if they have any further questions.  Risk  consent form and Pain Medication Agreement to be scanned into patient's chart.  The risks that can be encountered with and after liposuction were discussed and include the following but no limited to these:  Asymmetry, fluid accumulation, firmness of the area, fat necrosis with death of fat tissue, bleeding, infection, delayed healing, anesthesia risks, skin sensation changes, injury to structures including nerves, blood vessels, and muscles which may be temporary or permanent, allergies to tape, suture materials and glues, blood products, topical preparations or injected agents, skin and contour irregularities, skin discoloration and swelling, deep vein thrombosis, cardiac and pulmonary complications, pain, which may persist, persistent pain, recurrence of the lesion, poor healing of the incision, possible need for revisional surgery or staged procedures. Thiere can also be persistent swelling, poor wound healing, rippling or loose skin, worsening of cellulite, swelling, and thermal burn or heat injury from ultrasound with the ultrasound-assisted lipoplasty technique. Any change in weight fluctuations can alter the outcome.  We discussed risks of fat grafting include failure of the fat to take, need for additional procedures, bruising, swelling, pain from liposuction   Electronically signed by: Carola Rhine Brees [place], PA-C 04/03/2022 3:32 PM

## 2022-04-06 ENCOUNTER — Other Ambulatory Visit: Payer: Self-pay

## 2022-04-15 ENCOUNTER — Other Ambulatory Visit: Payer: Self-pay

## 2022-04-15 ENCOUNTER — Encounter (HOSPITAL_BASED_OUTPATIENT_CLINIC_OR_DEPARTMENT_OTHER): Payer: Self-pay | Admitting: Plastic Surgery

## 2022-04-23 ENCOUNTER — Other Ambulatory Visit: Payer: Self-pay

## 2022-04-23 ENCOUNTER — Encounter (HOSPITAL_BASED_OUTPATIENT_CLINIC_OR_DEPARTMENT_OTHER)
Admission: RE | Disposition: A | Discharge status: Home / Self Care | Payer: Self-pay | Source: Home / Self Care | Attending: Plastic Surgery

## 2022-04-23 ENCOUNTER — Other Ambulatory Visit: Payer: Self-pay | Admitting: Student

## 2022-04-23 ENCOUNTER — Ambulatory Visit (HOSPITAL_BASED_OUTPATIENT_CLINIC_OR_DEPARTMENT_OTHER): Payer: 59 | Admitting: Anesthesiology

## 2022-04-23 ENCOUNTER — Ambulatory Visit (HOSPITAL_BASED_OUTPATIENT_CLINIC_OR_DEPARTMENT_OTHER)
Admission: RE | Admit: 2022-04-23 | Discharge: 2022-04-23 | Disposition: A | Discharge status: Home / Self Care | Payer: 59 | Attending: Plastic Surgery | Admitting: Plastic Surgery

## 2022-04-23 ENCOUNTER — Other Ambulatory Visit: Payer: Self-pay | Admitting: Surgical

## 2022-04-23 ENCOUNTER — Encounter (HOSPITAL_BASED_OUTPATIENT_CLINIC_OR_DEPARTMENT_OTHER): Payer: Self-pay | Admitting: Plastic Surgery

## 2022-04-23 ENCOUNTER — Telehealth: Payer: Self-pay | Admitting: Plastic Surgery

## 2022-04-23 DIAGNOSIS — K2970 Gastritis, unspecified, without bleeding: Secondary | ICD-10-CM | POA: Insufficient documentation

## 2022-04-23 DIAGNOSIS — L905 Scar conditions and fibrosis of skin: Secondary | ICD-10-CM

## 2022-04-23 DIAGNOSIS — N6489 Other specified disorders of breast: Secondary | ICD-10-CM | POA: Insufficient documentation

## 2022-04-23 DIAGNOSIS — Z853 Personal history of malignant neoplasm of breast: Secondary | ICD-10-CM | POA: Insufficient documentation

## 2022-04-23 DIAGNOSIS — Z01818 Encounter for other preprocedural examination: Secondary | ICD-10-CM

## 2022-04-23 DIAGNOSIS — Z9012 Acquired absence of left breast and nipple: Secondary | ICD-10-CM | POA: Diagnosis not present

## 2022-04-23 DIAGNOSIS — N651 Disproportion of reconstructed breast: Secondary | ICD-10-CM

## 2022-04-23 HISTORY — PX: LIPOSUCTION WITH LIPOFILLING: SHX6436

## 2022-04-23 LAB — POCT PREGNANCY, URINE: Preg Test, Ur: NEGATIVE

## 2022-04-23 SURGERY — LIPOSUCTION, WITH FAT TRANSFER
Anesthesia: General | Site: Breast | Laterality: Left

## 2022-04-23 MED ORDER — LIDOCAINE HCL (PF) 1 % IJ SOLN
INTRAMUSCULAR | Status: AC
  Filled 2022-04-23: qty 30

## 2022-04-23 MED ORDER — MIDAZOLAM HCL 5 MG/5ML IJ SOLN
INTRAMUSCULAR | Status: DC | PRN
  Administered 2022-04-23: 2 mg via INTRAVENOUS

## 2022-04-23 MED ORDER — ONDANSETRON HCL 4 MG/2ML IJ SOLN
INTRAMUSCULAR | Status: DC | PRN
  Administered 2022-04-23: 4 mg via INTRAVENOUS

## 2022-04-23 MED ORDER — OXYCODONE HCL 5 MG PO TABS: 5.0000 mg | ORAL_TABLET | ORAL | Status: DC | PRN

## 2022-04-23 MED ORDER — LIDOCAINE-EPINEPHRINE 1 %-1:100000 IJ SOLN
INTRAMUSCULAR | Status: AC
  Filled 2022-04-23: qty 1

## 2022-04-23 MED ORDER — EPHEDRINE SULFATE (PRESSORS) 50 MG/ML IJ SOLN
INTRAMUSCULAR | Status: DC | PRN
  Administered 2022-04-23: 10 mg via INTRAVENOUS

## 2022-04-23 MED ORDER — CEPHALEXIN 500 MG PO CAPS: 500.0000 mg | ORAL_CAPSULE | Freq: Four times a day (QID) | ORAL | 0 refills | Status: AC

## 2022-04-23 MED ORDER — FENTANYL CITRATE (PF) 100 MCG/2ML IJ SOLN
INTRAMUSCULAR | Status: DC | PRN
  Administered 2022-04-23: 100 ug via INTRAVENOUS

## 2022-04-23 MED ORDER — OXYCODONE HCL 5 MG PO TABS
5.0000 mg | ORAL_TABLET | Freq: Four times a day (QID) | ORAL | 0 refills | Status: DC | PRN
  Filled 2022-04-23: qty 20, 5d supply, fill #0

## 2022-04-23 MED ORDER — CEFAZOLIN SODIUM-DEXTROSE 2-4 GM/100ML-% IV SOLN
INTRAVENOUS | Status: AC
  Filled 2022-04-23: qty 100

## 2022-04-23 MED ORDER — LIDOCAINE 2% (20 MG/ML) 5 ML SYRINGE
INTRAMUSCULAR | Status: DC | PRN
  Administered 2022-04-23: 60 mg via INTRAVENOUS

## 2022-04-23 MED ORDER — DEXAMETHASONE SODIUM PHOSPHATE 10 MG/ML IJ SOLN
INTRAMUSCULAR | Status: AC
  Filled 2022-04-23: qty 1

## 2022-04-23 MED ORDER — LIDOCAINE-EPINEPHRINE 1 %-1:100000 IJ SOLN
INTRAMUSCULAR | Status: DC | PRN
  Administered 2022-04-23: 6 mL

## 2022-04-23 MED ORDER — DEXAMETHASONE SODIUM PHOSPHATE 4 MG/ML IJ SOLN
INTRAMUSCULAR | Status: DC | PRN
  Administered 2022-04-23: 5 mg via INTRAVENOUS

## 2022-04-23 MED ORDER — ONDANSETRON HCL 4 MG/2ML IJ SOLN
INTRAMUSCULAR | Status: AC
  Filled 2022-04-23: qty 2

## 2022-04-23 MED ORDER — CHLORHEXIDINE GLUCONATE CLOTH 2 % EX PADS: 6.0000 | MEDICATED_PAD | Freq: Once | CUTANEOUS | Status: DC

## 2022-04-23 MED ORDER — SCOPOLAMINE 1 MG/3DAYS TD PT72
MEDICATED_PATCH | TRANSDERMAL | Status: AC
  Filled 2022-04-23: qty 1

## 2022-04-23 MED ORDER — SUCCINYLCHOLINE CHLORIDE 200 MG/10ML IV SOSY
PREFILLED_SYRINGE | INTRAVENOUS | Status: AC
  Filled 2022-04-23: qty 10

## 2022-04-23 MED ORDER — ACETAMINOPHEN 10 MG/ML IV SOLN
INTRAVENOUS | Status: DC | PRN
  Administered 2022-04-23: 1000 mg via INTRAVENOUS

## 2022-04-23 MED ORDER — ACETAMINOPHEN 500 MG PO TABS: 1000.0000 mg | ORAL_TABLET | Freq: Once | ORAL | Status: DC

## 2022-04-23 MED ORDER — MIDAZOLAM HCL 2 MG/2ML IJ SOLN
INTRAMUSCULAR | Status: AC
  Filled 2022-04-23: qty 2

## 2022-04-23 MED ORDER — CEPHALEXIN 500 MG PO CAPS
500.0000 mg | ORAL_CAPSULE | Freq: Four times a day (QID) | ORAL | 0 refills | Status: DC
  Filled 2022-04-23: qty 20, 5d supply, fill #0

## 2022-04-23 MED ORDER — 0.9 % SODIUM CHLORIDE (POUR BTL) OPTIME
TOPICAL | Status: DC | PRN
  Administered 2022-04-23: 1000 mL

## 2022-04-23 MED ORDER — EPHEDRINE 5 MG/ML INJ
INTRAVENOUS | Status: AC
  Filled 2022-04-23: qty 5

## 2022-04-23 MED ORDER — PROPOFOL 10 MG/ML IV BOLUS
INTRAVENOUS | Status: DC | PRN
  Administered 2022-04-23: 150 mg via INTRAVENOUS

## 2022-04-23 MED ORDER — BUPIVACAINE HCL (PF) 0.25 % IJ SOLN
INTRAMUSCULAR | Status: AC
  Filled 2022-04-23: qty 30

## 2022-04-23 MED ORDER — ONDANSETRON HCL 4 MG PO TABS: 4.0000 mg | ORAL_TABLET | Freq: Three times a day (TID) | ORAL | 0 refills | Status: DC | PRN

## 2022-04-23 MED ORDER — CEFAZOLIN SODIUM-DEXTROSE 2-4 GM/100ML-% IV SOLN
2.0000 g | INTRAVENOUS | Status: AC
  Administered 2022-04-23: 2 g via INTRAVENOUS

## 2022-04-23 MED ORDER — SODIUM CHLORIDE 0.9% FLUSH: 3.0000 mL | Freq: Two times a day (BID) | INTRAVENOUS | Status: DC

## 2022-04-23 MED ORDER — ACETAMINOPHEN 10 MG/ML IV SOLN
INTRAVENOUS | Status: AC
  Filled 2022-04-23: qty 100

## 2022-04-23 MED ORDER — LACTATED RINGERS IV SOLN: INTRAVENOUS | Status: DC

## 2022-04-23 MED ORDER — ACETAMINOPHEN 325 MG PO TABS: 650.0000 mg | ORAL_TABLET | ORAL | Status: DC | PRN

## 2022-04-23 MED ORDER — LIDOCAINE 2% (20 MG/ML) 5 ML SYRINGE
INTRAMUSCULAR | Status: AC
  Filled 2022-04-23: qty 5

## 2022-04-23 MED ORDER — EPINEPHRINE PF 1 MG/ML IJ SOLN
INTRAMUSCULAR | Status: AC
  Filled 2022-04-23: qty 1

## 2022-04-23 MED ORDER — LIDOCAINE HCL 1 % IJ SOLN
INTRAVENOUS | Status: DC | PRN
  Administered 2022-04-23: 1000 mL

## 2022-04-23 MED ORDER — SODIUM CHLORIDE 0.9% FLUSH: 3.0000 mL | INTRAVENOUS | Status: DC | PRN

## 2022-04-23 MED ORDER — OXYCODONE HCL 5 MG PO TABS: 5.0000 mg | ORAL_TABLET | Freq: Four times a day (QID) | ORAL | 0 refills | Status: DC | PRN

## 2022-04-23 MED ORDER — ONDANSETRON HCL 4 MG PO TABS
4.0000 mg | ORAL_TABLET | Freq: Three times a day (TID) | ORAL | 0 refills | Status: DC | PRN
  Filled 2022-04-23: qty 20, 7d supply, fill #0

## 2022-04-23 MED ORDER — PHENYLEPHRINE 80 MCG/ML (10ML) SYRINGE FOR IV PUSH (FOR BLOOD PRESSURE SUPPORT)
PREFILLED_SYRINGE | INTRAVENOUS | Status: AC
  Filled 2022-04-23: qty 10

## 2022-04-23 MED ORDER — SODIUM CHLORIDE 0.9 % IV SOLN: 250.0000 mL | INTRAVENOUS | Status: DC | PRN

## 2022-04-23 MED ORDER — FENTANYL CITRATE (PF) 100 MCG/2ML IJ SOLN: 25.0000 ug | INTRAMUSCULAR | Status: DC | PRN

## 2022-04-23 MED ORDER — ACETAMINOPHEN 325 MG RE SUPP: 650.0000 mg | RECTAL | Status: DC | PRN

## 2022-04-23 MED ORDER — FENTANYL CITRATE (PF) 100 MCG/2ML IJ SOLN
INTRAMUSCULAR | Status: AC
  Filled 2022-04-23: qty 2

## 2022-04-23 MED ORDER — ATROPINE SULFATE 0.4 MG/ML IV SOLN
INTRAVENOUS | Status: AC
  Filled 2022-04-23: qty 1

## 2022-04-23 SURGICAL SUPPLY — 47 items
ADH SKN CLS APL DERMABOND .7 (GAUZE/BANDAGES/DRESSINGS) ×2
BINDER ABDOMINAL  9 SM 30-45 (SOFTGOODS)
BINDER ABDOMINAL 10 UNV 27-48 (MISCELLANEOUS) IMPLANT
BINDER ABDOMINAL 12 SM 30-45 (SOFTGOODS) IMPLANT
BINDER ABDOMINAL 9 SM 30-45 (SOFTGOODS) IMPLANT
BINDER BREAST LRG (GAUZE/BANDAGES/DRESSINGS) IMPLANT
BINDER BREAST MEDIUM (GAUZE/BANDAGES/DRESSINGS) IMPLANT
BINDER BREAST XLRG (GAUZE/BANDAGES/DRESSINGS) IMPLANT
BINDER BREAST XXLRG (GAUZE/BANDAGES/DRESSINGS) IMPLANT
BLADE HEX COATED 2.75 (ELECTRODE) IMPLANT
BLADE SURG 15 STRL LF DISP TIS (BLADE) ×1 IMPLANT
BLADE SURG 15 STRL SS (BLADE) ×1
COVER BACK TABLE 60X90IN (DRAPES) ×1 IMPLANT
COVER MAYO STAND STRL (DRAPES) ×1 IMPLANT
DERMABOND ADVANCED .7 DNX12 (GAUZE/BANDAGES/DRESSINGS) ×1 IMPLANT
DRAPE LAPAROSCOPIC ABDOMINAL (DRAPES) ×1 IMPLANT
ELECT REM PT RETURN 9FT ADLT (ELECTROSURGICAL) ×1
ELECTRODE REM PT RTRN 9FT ADLT (ELECTROSURGICAL) ×1 IMPLANT
EXTRACTOR CANIST REVOLVE STRL (CANNISTER) ×1 IMPLANT
GAUZE PAD ABD 8X10 STRL (GAUZE/BANDAGES/DRESSINGS) ×2 IMPLANT
GLOVE BIO SURGEON STRL SZ 6.5 (GLOVE) ×3 IMPLANT
GLOVE BIOGEL PI IND STRL 7.0 (GLOVE) IMPLANT
GOWN STRL REUS W/ TWL LRG LVL3 (GOWN DISPOSABLE) ×2 IMPLANT
GOWN STRL REUS W/TWL LRG LVL3 (GOWN DISPOSABLE) ×2
IV LACTATED RINGERS 1000ML (IV SOLUTION) ×2 IMPLANT
LINER CANISTER 1000CC FLEX (MISCELLANEOUS) ×1 IMPLANT
NDL HYPO 25X1 1.5 SAFETY (NEEDLE) ×1 IMPLANT
NEEDLE HYPO 25X1 1.5 SAFETY (NEEDLE) ×1 IMPLANT
PACK BASIN DAY SURGERY FS (CUSTOM PROCEDURE TRAY) ×1 IMPLANT
PAD ALCOHOL SWAB (MISCELLANEOUS) ×1 IMPLANT
PAD FOAM SILICONE BACKED (GAUZE/BANDAGES/DRESSINGS) IMPLANT
PENCIL SMOKE EVACUATOR (MISCELLANEOUS) IMPLANT
SLEEVE SCD COMPRESS KNEE MED (STOCKING) ×1 IMPLANT
SPIKE FLUID TRANSFER (MISCELLANEOUS) IMPLANT
SPONGE T-LAP 18X18 ~~LOC~~+RFID (SPONGE) ×1 IMPLANT
SUT MNCRL AB 4-0 PS2 18 (SUTURE) IMPLANT
SUT MON AB 5-0 PS2 18 (SUTURE) ×2 IMPLANT
SYR 10ML LL (SYRINGE) ×5 IMPLANT
SYR 3ML 18GX1 1/2 (SYRINGE) IMPLANT
SYR 50ML LL SCALE MARK (SYRINGE) ×1 IMPLANT
SYR CONTROL 10ML LL (SYRINGE) ×1 IMPLANT
SYR TOOMEY 50ML (SYRINGE) IMPLANT
TOWEL GREEN STERILE FF (TOWEL DISPOSABLE) ×2 IMPLANT
TRAY DSU PREP LF (CUSTOM PROCEDURE TRAY) ×1 IMPLANT
TUBING INFILTRATION IT-10001 (TUBING) ×1 IMPLANT
TUBING SET GRADUATE ASPIR 12FT (MISCELLANEOUS) ×1 IMPLANT
UNDERPAD 30X36 HEAVY ABSORB (UNDERPADS AND DIAPERS) ×2 IMPLANT

## 2022-04-23 NOTE — Anesthesia Postprocedure Evaluation (Signed)
Anesthesia Post Note  Patient: Robin Riley  Procedure(s) Performed: Left breast fat grafting/lipo filling. (Left: Breast)     Patient location during evaluation: PACU Anesthesia Type: General Level of consciousness: awake and alert Pain management: pain level controlled Vital Signs Assessment: post-procedure vital signs reviewed and stable Respiratory status: spontaneous breathing, nonlabored ventilation, respiratory function stable and patient connected to nasal cannula oxygen Cardiovascular status: blood pressure returned to baseline and stable Postop Assessment: no apparent nausea or vomiting Anesthetic complications: no  No notable events documented.  Last Vitals:  Vitals:   04/23/22 0900 04/23/22 0915  BP: 106/69 109/76  Pulse: 90 79  Resp: (!) 21 16  Temp:  (!) 36.1 C  SpO2: 97% 98%    Last Pain:  Vitals:   04/23/22 0915  TempSrc:   PainSc: 0-No pain                 Denaisha Swango L Shekela Goodridge

## 2022-04-23 NOTE — Anesthesia Procedure Notes (Signed)
Procedure Name: LMA Insertion Date/Time: 04/23/2022 7:36 AM  Performed by: Willa Frater, CRNAPre-anesthesia Checklist: Patient identified, Emergency Drugs available, Suction available and Patient being monitored Patient Re-evaluated:Patient Re-evaluated prior to induction Oxygen Delivery Method: Circle system utilized Preoxygenation: Pre-oxygenation with 100% oxygen Induction Type: IV induction Ventilation: Mask ventilation without difficulty LMA: LMA inserted LMA Size: 4.0 Number of attempts: 1 Airway Equipment and Method: Bite block Placement Confirmation: positive ETCO2 Tube secured with: Tape Dental Injury: Teeth and Oropharynx as per pre-operative assessment

## 2022-04-23 NOTE — Interval H&P Note (Signed)
History and Physical Interval Note:  04/23/2022 7:10 AM  Robin Riley  has presented today for surgery, with the diagnosis of H/O mastectomy, left.  The various methods of treatment have been discussed with the patient and family. After consideration of risks, benefits and other options for treatment, the patient has consented to  Procedure(s): Left breast fat grafting/lipo filling. (Left) as a surgical intervention.  The patient's history has been reviewed, patient examined, no change in status, stable for surgery.  I have reviewed the patient's chart and labs.  Questions were answered to the patient's satisfaction.     Loel Lofty Isom Kochan

## 2022-04-23 NOTE — Telephone Encounter (Signed)
I called the patient and answered all of her questions to her satisfaction

## 2022-04-23 NOTE — Progress Notes (Signed)
Post op meds 

## 2022-04-23 NOTE — Progress Notes (Signed)
Postop meds.  Patient requesting medications to be sent to [facility] in [place].  I called [facility] and canceled the orders for the medications that were sent today.

## 2022-04-23 NOTE — Discharge Instructions (Addendum)
INSTRUCTIONS FOR AFTER BREAST SURGERY   You will likely have some questions about what to expect following your operation.  The following information will help you and your family understand what to expect when you are discharged from the hospital.  It is important to follow these guidelines to help ensure a smooth recovery and reduce complication.  Postoperative instructions include information on: diet, wound care, medications and physical activity.  AFTER SURGERY Expect to go home after the procedure.  In some cases, you may need to spend one night in the hospital for observation.  DIET Breast surgery does not require a specific diet.  However, the healthier you eat the better your body will heal. It is important to increasing your protein intake.  This means limiting the foods with sugar and carbohydrates.  Focus on vegetables and some meat.  If you have liposuction during your procedure be sure to drink water.  If your urine is bright yellow, then it is concentrated, and you need to drink more water.  As a general rule after surgery, you should have 8 ounces of water every hour while awake.  If you find you are persistently nauseated or unable to take in liquids let us know.  NO TOBACCO USE or EXPOSURE.  This will slow your healing process and lead to a wound.  WOUND CARE Leave the binder on for 3 days . Use fragrance free soap like Dial, Dove or [place].   After 3 days you can remove the binder to shower. Once dry apply binder or sports bra. If you have liposuction you will have a soft and spongy dressing (Lipofoam) that helps prevent creases in your skin.  Remove before you shower and then replace it.  It is also available on [employer]. If you have steri-strips / tape directly attached to your skin leave them in place. It is OK to get these wet.   No baths, pools or hot tubs for four weeks. We close your incision to leave the smallest and best-looking scar. No ointment or creams on your incisions  for four weeks.  No Neosporin (Too many skin reactions).  A few weeks after surgery you can use Mederma and start massaging the scar. We ask you to wear your binder or sports bra for the first 6 weeks around the clock, including while sleeping. This provides added comfort and helps reduce the fluid accumulation at the surgery site. NO Ice or heating pads to the operative site.  You have a very high risk of a BURN before you feel the temperature change.  ACTIVITY No heavy lifting until cleared by the doctor.  This usually means no more than a half-gallon of milk.  It is OK to walk and climb stairs. Moving your legs is very important to decrease your risk of a blood clot.  It will also help keep you from getting deconditioned.  Every 1 to 2 hours get up and walk for 5 minutes. This will help with a quicker recovery back to normal.  Let pain be your guide so you don't do too much.  This time is for you to recover.  You will be more comfortable if you sleep and rest with your head elevated either with a few pillows under you or in a recliner.  No stomach sleeping for a three months.  WORK Everyone returns to work at different times. As a rough guide, most people take at least 1 - 2 weeks off prior to returning to work. If  you need documentation for your job, give the forms to the front staff at the clinic.  DRIVING Arrange for someone to bring you home from the hospital after your surgery.  You may be able to drive a few days after surgery but not while taking any narcotics or valium.  BOWEL MOVEMENTS Constipation can occur after anesthesia and while taking pain medication.  It is important to stay ahead for your comfort.  We recommend taking Milk of Magnesia (2 tablespoons; twice a day) while taking the pain pills.  MEDICATIONS You may be prescribed should start after surgery At your preoperative visit for you history and physical you may have been given the following medications: An antibiotic: Start  this medication when you get home and take according to the instructions on the bottle. Zofran 4 mg:  This is to treat nausea and vomiting.  You can take this every 6 hours as needed and only if needed. Valium 2 mg for breast cancer patients: This is for muscle tightness if you have an implant or expander. This will help relax your muscle which also helps with pain control.  This can be taken every 12 hours as needed. Don't drive after taking this medication. Norco (hydrocodone/acetaminophen) 5/325 mg:  This is only to be used after you have taken the Motrin or the Tylenol. Every 8 hours as needed.   Over the counter Medication to take: Ibuprofen (Motrin) 600 mg:  Take this every 6 hours.  If you have additional pain then take 500 mg of the Tylenol every 8 hours.  Only take the Norco after you have tried these two. No Tylenol until after 1:40pm today. MiraLAX or Milk of Magnesia: Take this according to the bottle if you take the [place] Call your surgeon's office if any of the following occur: Fever 101 degrees F or greater Excessive bleeding or fluid from the incision site. Pain that increases over time without aid from the medications Redness, warmth, or pus draining from incision sites Persistent nausea or inability to take in liquids Severe misshapen area that underwent the operation.   Post Anesthesia Home Care Instructions  Activity: Get plenty of rest for the remainder of the day. A responsible individual must stay with you for 24 hours following the procedure.  For the next 24 hours, DO NOT: -Drive a car -Paediatric nurse -Drink alcoholic beverages -Take any medication unless instructed by your physician -Make any legal decisions or sign important papers.  Meals: Start with liquid foods such as gelatin or soup. Progress to regular foods as tolerated. Avoid greasy, spicy, heavy foods. If nausea and/or vomiting occur, drink only clear liquids until the nausea  and/or vomiting subsides. Call your physician if vomiting continues.  Special Instructions/Symptoms: Your throat may feel dry or sore from the anesthesia or the breathing tube placed in your throat during surgery. If this causes discomfort, gargle with warm salt water. The discomfort should disappear within 24 hours.  If you had a scopolamine patch placed behind your ear for the management of post- operative nausea and/or vomiting:  1. The medication in the patch is effective for 72 hours, after which it should be removed.  Wrap patch in a tissue and discard in the trash. Wash hands thoroughly with soap and water. 2. You may remove the patch earlier than 72 hours if you experience unpleasant side effects which may include dry mouth, dizziness or visual disturbances. 3. Avoid touching the patch. Wash your hands with soap  and water after contact with the patch.

## 2022-04-23 NOTE — Op Note (Signed)
DATE OF OPERATION: 04/23/2022  LOCATION: [facility]  PREOPERATIVE DIAGNOSIS: breast asymmetry after left mastectomy for breast disease  POSTOPERATIVE DIAGNOSIS: Same  PROCEDURE: Lipofilling of left breast for symmetry (20 cc) and release of the scar contracture of the breast 5 x 5 cm  SURGEON: Audelia Hives, DO  ASSISTANT: Roetta Sessions, PA  EBL: 2 cc  CONDITION: Stable  COMPLICATIONS: None  INDICATION: The patient, Robin Riley, is a 47 y.o. female born on [birth year], is here for treatment of breast asymmetry after treatment for breast cancer.   PROCEDURE DETAILS:  The patient was seen prior to surgery and marked.  The IV antibiotics were given. The patient was taken to the operating room and given a general anesthetic. A standard time out was performed and all information was confirmed by those in the room. SCDs were placed.   The chest and abdomen were prepped and draped.  Local with epinephrine was placed in the lower abdominal area in 1 cm area on the left and right.  The #15 blade was used to make a small incision and the tumescent was infused (300 cc).  The liposuction was then done. A small incision was made in the medial and lateral scar from the mastectomy at the inframammary fold with a #15 blade.  The forked knife was used to release an area of 5 x 5 cm contracture of the lateral superior breast.  The adipose was collected and injected into the left lateral superior breast (20 cc) using a 10 cc syringe.  The incisions were closed with the 5-0 Monocryl. The patient was allowed to wake up and taken to recovery room in stable condition at the end of the case. The family was notified at the end of the case.   The advanced practice practitioner (APP) assisted throughout the case.  The APP was essential in retraction and counter traction when needed to make the case progress smoothly.  This retraction and assistance made it possible to see the tissue plans for  the procedure.  The assistance was needed for blood control, tissue re-approximation and assisted with closure of the incision site.

## 2022-04-23 NOTE — Transfer of Care (Signed)
Immediate Anesthesia Transfer of Care Note  Patient: Robin Riley  Procedure(s) Performed: Left breast fat grafting/lipo filling. (Left: Breast)  Patient Location: PACU  Anesthesia Type:General  Level of Consciousness: sedated  Airway & Oxygen Therapy: Patient Spontanous Breathing and Patient connected to face mask oxygen  Post-op Assessment: Report given to RN and Post -op Vital signs reviewed and stable  Post vital signs: Reviewed and stable  Last Vitals:  Vitals Value Taken Time  BP    Temp    Pulse 78 04/23/22 0832  Resp 18 04/23/22 0832  SpO2 99 % 04/23/22 0832  Vitals shown include unvalidated device data.  Last Pain:  Vitals:   04/23/22 0657  TempSrc: Oral  PainSc: 0-No pain      Patients Stated Pain Goal: 8 ([id number])  Complications: No notable events documented.

## 2022-04-23 NOTE — Telephone Encounter (Signed)
Patient called to clarify instructions for using abdominal strap and breast binder. Pt says she thought she was to wear the abdominal strap and the breast binder for 3 DAYS then starting day 4 to switch to spanks and a front zip sports bra. Please call pt [phone]

## 2022-04-23 NOTE — Anesthesia Preprocedure Evaluation (Addendum)
Anesthesia Evaluation  Patient identified by MRN, date of birth, ID band Patient awake    Reviewed: Allergy & Precautions, NPO status , Patient's Chart, lab work & pertinent test results  Airway Mallampati: II  TM Distance: >3 FB Neck ROM: Full    Dental no notable dental hx. (+) Teeth Intact, Dental Advisory Given   Pulmonary neg pulmonary ROS   Pulmonary exam normal breath sounds clear to auscultation       Cardiovascular negative cardio ROS Normal cardiovascular exam Rhythm:Regular Rate:Normal     Neuro/Psych negative neurological ROS  negative psych ROS   GI/Hepatic negative GI ROS, Neg liver ROS,,,  Endo/Other  negative endocrine ROS    Renal/GU negative Renal ROS  negative genitourinary   Musculoskeletal negative musculoskeletal ROS (+)    Abdominal   Peds  Hematology negative hematology ROS (+)   Anesthesia Other Findings Adrenal tumor, breast CA  Reproductive/Obstetrics                             Anesthesia Physical Anesthesia Plan  ASA: 2  Anesthesia Plan: General   Post-op Pain Management: Tylenol PO (pre-op)*   Induction: Intravenous  PONV Risk Score and Plan: 3 and Ondansetron, Dexamethasone and Midazolam  Airway Management Planned: LMA and Oral ETT  Additional Equipment:   Intra-op Plan:   Post-operative Plan: Extubation in OR  Informed Consent: I have reviewed the patients History and Physical, chart, labs and discussed the procedure including the risks, benefits and alternatives for the proposed anesthesia with the patient or authorized representative who has indicated his/her understanding and acceptance.     Dental advisory given  Plan Discussed with: CRNA  Anesthesia Plan Comments:        Anesthesia Quick Evaluation

## 2022-04-24 ENCOUNTER — Telehealth: Payer: Self-pay

## 2022-04-24 ENCOUNTER — Encounter (HOSPITAL_BASED_OUTPATIENT_CLINIC_OR_DEPARTMENT_OTHER): Payer: Self-pay | Admitting: Plastic Surgery

## 2022-04-24 NOTE — Telephone Encounter (Signed)
Patient called inquiring about dressing directions. Suggested for patient to wait 3 days before taking a shower. Can let the soap and water run down from her shoulder, but avoid direct water pressure. Pat incision dry, apply Vaseline to abdominal incision, and cover with ABD pads to avoid skin irritation from binder. May use the lipo pads on the side of hips. Recommended if she wore spanx, to wear the high waste style.  Patient understood and agreed with plan.

## 2022-04-27 ENCOUNTER — Ambulatory Visit (INDEPENDENT_AMBULATORY_CARE_PROVIDER_SITE_OTHER): Payer: 59 | Admitting: Physician Assistant

## 2022-04-27 DIAGNOSIS — Z9889 Other specified postprocedural states: Secondary | ICD-10-CM

## 2022-04-27 NOTE — Progress Notes (Signed)
This is a 47 year old female who is status post Lipo filling in the left breast for symmetry and release of scar contracture by Dr. Marla Roe on 04/23/2022.  She is evaluated via telephone visit today.  She is located in [place].  Overall the patient notes she is doing well, she denies any significant pain.  She has no complaints on our phone call today.  Physical exam.  Patient evaluated via telephone today.  She is speaking in full sentences in no acute distress.  Overall patient appears to be doing well with no significant complaints or concerns.  She was given strict return precautions, we will see her in our office soon.  She will reach out immediately if she develops any new or worsening signs or symptoms.

## 2022-04-28 ENCOUNTER — Other Ambulatory Visit: Payer: Self-pay

## 2022-05-01 ENCOUNTER — Encounter: Payer: Self-pay | Admitting: Plastic Surgery

## 2022-05-01 ENCOUNTER — Ambulatory Visit (INDEPENDENT_AMBULATORY_CARE_PROVIDER_SITE_OTHER): Payer: 59 | Admitting: Plastic Surgery

## 2022-05-01 VITALS — BP 123/85 | HR 65

## 2022-05-01 DIAGNOSIS — Z9889 Other specified postprocedural states: Secondary | ICD-10-CM

## 2022-05-01 DIAGNOSIS — N651 Disproportion of reconstructed breast: Secondary | ICD-10-CM

## 2022-05-01 NOTE — Progress Notes (Signed)
The patient is a 47 year old female here after lipo filling of the left breast.  She is doing really well.  She has a lot of bruising as expected.  Her abdomen is doing well but a little tender.  No sign of seroma and no sign of infection.  We will plan to see her back in the next couple of months.  Continue with sports bra and Lipo foam with tight spanks or tight sports pants.  Pictures were obtained of the patient and placed in the chart with the patient's or guardian's permission.

## 2022-05-07 ENCOUNTER — Telehealth: Payer: Self-pay | Admitting: Plastic Surgery

## 2022-05-07 NOTE — Telephone Encounter (Signed)
Would like a call back to answer some questions regarding lots of questions that feels to be anwered before her next appt.

## 2022-05-08 ENCOUNTER — Telehealth (INDEPENDENT_AMBULATORY_CARE_PROVIDER_SITE_OTHER): Payer: Self-pay | Admitting: Surgical

## 2022-05-08 DIAGNOSIS — N651 Disproportion of reconstructed breast: Secondary | ICD-10-CM

## 2022-05-08 DIAGNOSIS — Z9889 Other specified postprocedural states: Secondary | ICD-10-CM

## 2022-05-08 NOTE — Progress Notes (Signed)
Patient is a 47 year old female status post fat grafting to her left breast and release of scar contracture with Dr. Marla Roe on 04/25/2022.  She has some questions about compression garments and returning to exercise.  The patient gave consent to have this visit done by telemedicine / virtual visit, two identifiers were used to identify patient. This is also consent for access the chart and treat the patient via this visit. The patient is located in [place].  I, the provider, am at the office.  We spent 5 minutes together for the visit.  Joined by telephone.   Recommend continue with compressive garments to her abdomen and breast 24/7, in regards to activity she can try returning to spin bike/stationary bike but would avoid anything strenuous.  She can lift up to 10 to 15 pounds, increase intensity as tolerated.  But recommend no greater than 15 pounds at this time.  Recommend avoiding submersion of the incisions and pools, hot tub or any water source.  She has an appointment in 1 week in person.  Call with questions or concerns.

## 2022-05-14 ENCOUNTER — Encounter: Payer: 59 | Admitting: Surgical

## 2022-05-15 ENCOUNTER — Ambulatory Visit (INDEPENDENT_AMBULATORY_CARE_PROVIDER_SITE_OTHER): Payer: 59 | Admitting: Surgical

## 2022-05-15 DIAGNOSIS — N651 Disproportion of reconstructed breast: Secondary | ICD-10-CM

## 2022-05-15 DIAGNOSIS — Z9889 Other specified postprocedural states: Secondary | ICD-10-CM

## 2022-05-15 NOTE — Progress Notes (Signed)
Patient is a 47 year old female here for follow-up after fat grafting to left breast and release of left breast scar contracture with Dr. Marla Roe on 04/23/2022.  She is 3 weeks postop.  She reports overall she is doing very well, she has had minimal pain.  She is not having any swelling of her abdomen or breast.  She has continued to wear compressive garments and has been exercising and feels as if this has been going well.  She has been avoiding any heavy lifting.  She reports that overall she notices improvement in the left breast, but does notice the superior lateral pole still has a slight area of volume loss.  Chaperone present on exam On exam left breast incisions are intact and healing well, ecchymosis of left breast is significantly improving.  I do not appreciate any cellulitic changes or erythema on exam.  No subcutaneous fluid collection noted palpation.  She does not have any fat necrosis with palpation of the left breast.  Abdominal incisions are intact and healing well.  There is no abdominal swelling or tenderness noted.  A/P:  Continue with compressive garments to abdomen and breast, which needs activities or heavy lifting. In regards to the breast compression, recommend continue wear 24/7 for 2 more weeks. In regards to the abdominal compression, recommend wearing when active. Recommend following up in 2 weeks for reevaluation  Pictures were obtained of the patient and placed in the chart with the patient's or guardian's permission.

## 2022-05-19 DIAGNOSIS — Z6824 Body mass index (BMI) 24.0-24.9, adult: Secondary | ICD-10-CM | POA: Diagnosis not present

## 2022-05-19 DIAGNOSIS — Z01419 Encounter for gynecological examination (general) (routine) without abnormal findings: Secondary | ICD-10-CM | POA: Diagnosis not present

## 2022-05-20 ENCOUNTER — Other Ambulatory Visit: Payer: Self-pay | Admitting: Obstetrics and Gynecology

## 2022-05-20 DIAGNOSIS — Z1231 Encounter for screening mammogram for malignant neoplasm of breast: Secondary | ICD-10-CM

## 2022-05-20 LAB — HM PAP SMEAR: HM Pap smear: NORMAL

## 2022-05-27 ENCOUNTER — Ambulatory Visit (INDEPENDENT_AMBULATORY_CARE_PROVIDER_SITE_OTHER): Payer: 59 | Admitting: Internal Medicine

## 2022-05-27 ENCOUNTER — Encounter: Payer: Self-pay | Admitting: Internal Medicine

## 2022-05-27 VITALS — BP 96/64 | HR 71 | Temp 97.7°F | Ht 63.0 in | Wt 130.8 lb

## 2022-05-27 DIAGNOSIS — B351 Tinea unguium: Secondary | ICD-10-CM

## 2022-05-27 DIAGNOSIS — D126 Benign neoplasm of colon, unspecified: Secondary | ICD-10-CM

## 2022-05-27 DIAGNOSIS — Z0000 Encounter for general adult medical examination without abnormal findings: Secondary | ICD-10-CM | POA: Diagnosis not present

## 2022-05-27 DIAGNOSIS — E785 Hyperlipidemia, unspecified: Secondary | ICD-10-CM | POA: Diagnosis not present

## 2022-05-27 DIAGNOSIS — R5383 Other fatigue: Secondary | ICD-10-CM | POA: Diagnosis not present

## 2022-05-27 DIAGNOSIS — R7301 Impaired fasting glucose: Secondary | ICD-10-CM | POA: Diagnosis not present

## 2022-05-27 DIAGNOSIS — N6092 Unspecified benign mammary dysplasia of left breast: Secondary | ICD-10-CM | POA: Diagnosis not present

## 2022-05-27 NOTE — Assessment & Plan Note (Signed)

## 2022-05-27 NOTE — Assessment & Plan Note (Signed)
S/p left mastectomy in 2017 w, complete mastecteom y in 2019 ith breast implant done and currently under repair by plastic surgery

## 2022-05-27 NOTE — Patient Instructions (Addendum)
Good to see you!   I agree with avoiding all plant based menopause treatments if they contain plant estrogens ( including soy)    The book I referred to today when we were discussing your GI issues is Dr Claris Pong Bennett's book "AIP Diet for Beginners: A Comprehensive Guide to the Autoimmune Paleo protocol"

## 2022-05-27 NOTE — Progress Notes (Signed)
Patient ID: Robin Riley, female    DOB: 01/19/1976  Age: 48 y.o. MRN: [id number]  The patient is here for annual preventive examination and management of other chronic and acute problems.   The risk factors are reflected in the social history.   The roster of all physicians providing medical care to patient - is listed in the Snapshot section of the chart.   Activities of daily living:  The patient is 100% independent in all ADLs: dressing, toileting, feeding as well as independent mobility   Home safety : The patient has smoke detectors in the home. They wear seatbelts.  There are no unsecured firearms at home. There is no violence in the home.    There is no risks for hepatitis, STDs or HIV. There is no   history of blood transfusion. They have no travel history to infectious disease endemic areas of the world.   The patient has seen their dentist in the last six month. They have seen their eye doctor in the last year. The patinet  denies slight hearing difficulty with regard to whispered voices and some television programs.  They have deferred audiologic testing in the last year.  They do not  have excessive sun exposure. Discussed the need for sun protection: hats, long sleeves and use of sunscreen if there is significant sun exposure.    Diet: the importance of a healthy diet is discussed. They do have a healthy diet.   The benefits of regular aerobic exercise were discussed. The patient  exercises  3 to 5 days per week  for  60 minutes.    Depression screen: there are no signs or vegative symptoms of depression- irritability, change in appetite, anhedonia, sadness/tearfullness.   The following portions of the patient's history were reviewed and updated as appropriate: allergies, current medications, past family history, past medical history,  past surgical history, past social history  and problem list.   Visual acuity was not assessed per patient preference since the patient has  regular follow up with an  ophthalmologist. Hearing and body mass index were assessed and reviewed.    During the course of the visit the patient was educated and counseled about appropriate screening and preventive services including : fall prevention , diabetes screening, nutrition counseling, colorectal cancer screening, and recommended immunizations.    Chief Complaint:      Review of Symptoms  Patient denies headache, fevers, malaise, unintentional weight loss, skin rash, eye pain, sinus congestion and sinus pain, sore throat, dysphagia,  hemoptysis , cough, dyspnea, wheezing, chest pain, palpitations, orthopnea, edema, abdominal pain, nausea, melena, diarrhea, constipation, flank pain, dysuria, hematuria, urinary  Frequency, nocturia, numbness, tingling, seizures,  Focal weakness, Loss of consciousness,  Tremor, insomnia, depression, anxiety, and suicidal ideation.    Physical Exam:  BP 96/64   Pulse 71   Temp 97.7 F (36.5 C) (Oral)   Ht 5\' 3"  (1.6 m)   Wt 130 lb 12.8 oz (59.3 kg)   SpO2 98%   BMI 23.17 kg/m    Physical Exam Vitals reviewed.  Constitutional:      General: She is not in acute distress.    Appearance: Normal appearance. She is normal weight. She is not ill-appearing, toxic-appearing or diaphoretic.  HENT:     Head: Normocephalic.  Eyes:     General: No scleral icterus.       Right eye: No discharge.        Left eye: No discharge.  Conjunctiva/sclera: Conjunctivae normal.  Cardiovascular:     Rate and Rhythm: Normal rate and regular rhythm.     Heart sounds: Normal heart sounds.  Pulmonary:     Effort: Pulmonary effort is normal. No respiratory distress.     Breath sounds: Normal breath sounds.  Musculoskeletal:        General: Normal range of motion.  Skin:    General: Skin is warm and dry.  Neurological:     General: No focal deficit present.     Mental Status: She is alert and oriented to person, place, and time. Mental status is at  baseline.  Psychiatric:        Mood and Affect: Mood normal.        Behavior: Behavior normal.        Thought Content: Thought content normal.        Judgment: Judgment normal.     Assessment and Plan: Hyperlipidemia, unspecified hyperlipidemia type -     Lipid panel -     LDL cholesterol, direct  Impaired fasting glucose -     Comprehensive metabolic panel -     Hemoglobin A1c  Other fatigue -     TSH -     CBC with Differential/Platelet  Encounter for preventive health examination    No follow-ups on file.  Crecencio Mc, MD

## 2022-05-28 NOTE — Assessment & Plan Note (Addendum)
Right thumbnail,  likely due to prlonged wearing of artificial nails. Treating with topical ciclopirox

## 2022-05-29 ENCOUNTER — Encounter: Payer: 59 | Admitting: Surgical

## 2022-05-29 ENCOUNTER — Ambulatory Visit (INDEPENDENT_AMBULATORY_CARE_PROVIDER_SITE_OTHER): Payer: 59 | Admitting: Surgical

## 2022-05-29 DIAGNOSIS — Z9012 Acquired absence of left breast and nipple: Secondary | ICD-10-CM

## 2022-05-29 DIAGNOSIS — N651 Disproportion of reconstructed breast: Secondary | ICD-10-CM

## 2022-05-29 NOTE — Progress Notes (Signed)
Patient is a very pleasant 47 year old female here for follow-up after fat grafting to left breast and release of left breast scar contracture with Dr. Marla Roe on 04/23/2022.  She is 5 weeks postop.  She reports overall she is doing well.  She still has some tenderness of her right lower abdomen with exercise.  She reports she has been active but has been avoiding lifting over the restricted weights.  She feels that she has noticed an improvement in the rippling on the left side.  Chaperone present on exam On exam left lateral breast incision is intact, incision is approximately 5 mm.  It is well-healed.  Superior pole of left breast with good volume noted, no rippling or wrinkling noted.  No volume loss noted.  There is no erythema or cellulitic changes noted.  No subcutaneous fluid collection noted.  A/P:  Patient is doing well after fat grafting and release of left breast scar contracture with Dr. Marla Roe on 04/23/2022.  She is healing well.  We discussed continue with compressive garments, she can increase activity at this time. Recommend following up in 1 year for reevaluation.  No signs of infection or concern on exam.

## 2022-07-20 ENCOUNTER — Ambulatory Visit: Payer: 59

## 2022-07-22 ENCOUNTER — Ambulatory Visit
Admission: RE | Admit: 2022-07-22 | Discharge: 2022-07-22 | Disposition: A | Discharge status: Home / Self Care | Payer: 59 | Source: Ambulatory Visit | Attending: Obstetrics and Gynecology | Admitting: Obstetrics and Gynecology

## 2022-07-22 DIAGNOSIS — Z1231 Encounter for screening mammogram for malignant neoplasm of breast: Secondary | ICD-10-CM | POA: Diagnosis not present

## 2023-01-12 ENCOUNTER — Ambulatory Visit: Payer: 59 | Admitting: Dermatology

## 2023-04-07 ENCOUNTER — Other Ambulatory Visit (HOSPITAL_COMMUNITY): Payer: Self-pay

## 2023-04-07 ENCOUNTER — Other Ambulatory Visit: Payer: Self-pay

## 2023-04-07 MED ORDER — TRAMADOL HCL 50 MG PO TABS: ORAL_TABLET | ORAL | 0 refills | Status: DC

## 2023-04-07 MED ORDER — CEPHALEXIN 500 MG PO CAPS: ORAL_CAPSULE | ORAL | 0 refills | Status: DC

## 2023-05-20 DIAGNOSIS — Z6823 Body mass index (BMI) 23.0-23.9, adult: Secondary | ICD-10-CM | POA: Diagnosis not present

## 2023-05-20 DIAGNOSIS — Z01419 Encounter for gynecological examination (general) (routine) without abnormal findings: Secondary | ICD-10-CM | POA: Diagnosis not present

## 2023-05-20 DIAGNOSIS — Z124 Encounter for screening for malignant neoplasm of cervix: Secondary | ICD-10-CM | POA: Diagnosis not present

## 2023-05-24 ENCOUNTER — Telehealth: Payer: Self-pay

## 2023-05-24 ENCOUNTER — Telehealth: Payer: Self-pay | Admitting: Plastic Surgery

## 2023-05-24 NOTE — Telephone Encounter (Signed)
 Would like to know when she should have her next MRI done on the side where she had the mastectomy done?

## 2023-05-24 NOTE — Telephone Encounter (Signed)
 Patient called with a question about when to get an MRI. I told her the recommendation from our standpoint would be 2-3 years but she should also talk with her Oncologist/General surgeon. She has an appointment to see [place] in June of 2025.

## 2023-05-24 NOTE — Telephone Encounter (Signed)
 In regards to her implant, she should have imaging every 2-3 years to evaluate implant integrity. In regards to cancer screening/monitoring, she should discuss with oncology or general surgery

## 2023-05-28 ENCOUNTER — Ambulatory Visit: Payer: 59 | Admitting: Plastic Surgery

## 2023-06-03 ENCOUNTER — Encounter: Payer: Self-pay | Admitting: Internal Medicine

## 2023-06-03 ENCOUNTER — Other Ambulatory Visit: Payer: Self-pay

## 2023-06-03 ENCOUNTER — Ambulatory Visit (INDEPENDENT_AMBULATORY_CARE_PROVIDER_SITE_OTHER): Payer: 59 | Admitting: Internal Medicine

## 2023-06-03 VITALS — BP 100/78 | HR 85 | Temp 97.5°F | Resp 2 | Ht 63.0 in | Wt 125.4 lb

## 2023-06-03 DIAGNOSIS — E538 Deficiency of other specified B group vitamins: Secondary | ICD-10-CM

## 2023-06-03 DIAGNOSIS — R5383 Other fatigue: Secondary | ICD-10-CM

## 2023-06-03 DIAGNOSIS — E785 Hyperlipidemia, unspecified: Secondary | ICD-10-CM

## 2023-06-03 DIAGNOSIS — D649 Anemia, unspecified: Secondary | ICD-10-CM | POA: Diagnosis not present

## 2023-06-03 DIAGNOSIS — Z0000 Encounter for general adult medical examination without abnormal findings: Secondary | ICD-10-CM

## 2023-06-03 DIAGNOSIS — R7301 Impaired fasting glucose: Secondary | ICD-10-CM | POA: Diagnosis not present

## 2023-06-03 LAB — CBC WITH DIFFERENTIAL/PLATELET
Basophils Absolute: 0 10*3/uL (ref 0.0–0.1)
Basophils Relative: 1.2 % (ref 0.0–3.0)
Eosinophils Absolute: 0.1 10*3/uL (ref 0.0–0.7)
Eosinophils Relative: 1.8 % (ref 0.0–5.0)
HCT: 38.2 % (ref 36.0–46.0)
Hemoglobin: 12.4 g/dL (ref 12.0–15.0)
Lymphocytes Relative: 37.4 % (ref 12.0–46.0)
Lymphs Abs: 1.3 10*3/uL (ref 0.7–4.0)
MCHC: 32.5 g/dL (ref 30.0–36.0)
MCV: 84.2 fl (ref 78.0–100.0)
Monocytes Absolute: 0.2 10*3/uL (ref 0.1–1.0)
Monocytes Relative: 6.1 % (ref 3.0–12.0)
Neutro Abs: 1.9 10*3/uL (ref 1.4–7.7)
Neutrophils Relative %: 53.5 % (ref 43.0–77.0)
Platelets: 247 10*3/uL (ref 150.0–400.0)
RBC: 4.54 Mil/uL (ref 3.87–5.11)
RDW: 13.6 % (ref 11.5–15.5)
WBC: 3.5 10*3/uL — ABNORMAL LOW (ref 4.0–10.5)

## 2023-06-03 LAB — HEMOGLOBIN A1C: Hgb A1c MFr Bld: 5.7 % (ref 4.6–6.5)

## 2023-06-03 LAB — COMPREHENSIVE METABOLIC PANEL WITH GFR
ALT: 34 U/L (ref 0–35)
AST: 26 U/L (ref 0–37)
Albumin: 4.7 g/dL (ref 3.5–5.2)
Alkaline Phosphatase: 55 U/L (ref 39–117)
BUN: 17 mg/dL (ref 6–23)
CO2: 31 meq/L (ref 19–32)
Calcium: 9.6 mg/dL (ref 8.4–10.5)
Chloride: 101 meq/L (ref 96–112)
Creatinine, Ser: 0.58 mg/dL (ref 0.40–1.20)
GFR: 106.22 mL/min (ref >60.00)
Glucose, Bld: 83 mg/dL (ref 70–99)
Potassium: 3.9 meq/L (ref 3.5–5.1)
Sodium: 138 meq/L (ref 135–145)
Total Bilirubin: 0.4 mg/dL (ref 0.2–1.2)
Total Protein: 7 g/dL (ref 6.0–8.3)

## 2023-06-03 LAB — LIPID PANEL
Cholesterol: 212 mg/dL — ABNORMAL HIGH (ref 0–200)
HDL: 77.1 mg/dL (ref >39.00)
LDL Cholesterol: 129 mg/dL — ABNORMAL HIGH (ref 0–99)
NonHDL: 135.29
Total CHOL/HDL Ratio: 3
Triglycerides: 31 mg/dL (ref 0.0–149.0)
VLDL: 6.2 mg/dL (ref 0.0–40.0)

## 2023-06-03 LAB — B12 AND FOLATE PANEL
Folate: 8.6 ng/mL (ref >5.9)
Vitamin B-12: 357 pg/mL (ref 211–911)

## 2023-06-03 LAB — LDL CHOLESTEROL, DIRECT: Direct LDL: 128 mg/dL

## 2023-06-03 MED ORDER — PREDNISONE 10 MG PO TABS
ORAL_TABLET | ORAL | 0 refills | Status: AC
  Filled 2023-06-03: qty 21, 6d supply, fill #0

## 2023-06-03 MED ORDER — CYANOCOBALAMIN 1000 MCG/ML IJ SOLN
1000.0000 ug | INTRAMUSCULAR | 2 refills | Status: AC
  Filled 2023-06-03: qty 4, 28d supply, fill #0
  Filled 2023-11-08: qty 4, 28d supply, fill #1

## 2023-06-03 MED ORDER — DOXYCYCLINE HYCLATE 100 MG PO TABS
100.0000 mg | ORAL_TABLET | Freq: Two times a day (BID) | ORAL | 0 refills | Status: DC
  Filled 2023-06-03: qty 14, 7d supply, fill #0

## 2023-06-03 MED ORDER — ONDANSETRON 4 MG PO TBDP
4.0000 mg | ORAL_TABLET | Freq: Three times a day (TID) | ORAL | 0 refills | Status: DC | PRN
  Filled 2023-06-03: qty 20, 7d supply, fill #0

## 2023-06-03 NOTE — Assessment & Plan Note (Signed)
 Recent fingerstick hgb of 10 was incorrect.  Iron studies pending   Lab Results  Component Value Date   WBC 3.5 (L) 06/03/2023   HGB 12.4 06/03/2023   HCT 38.2 06/03/2023   MCV 84.2 06/03/2023   PLT 247.0 06/03/2023  No results found for: "IRON", "TIBC", "FERRITIN"

## 2023-06-03 NOTE — Patient Instructions (Addendum)
 REsume B12 injections weekly x 4,  then monthly  Additional supplements pending review of today's labs   [facility] Fever  can present with fever and a headache, NOT a rash; s the occurrence of these symptoms during spring/summer/fall in the context of recent tick exposure is RMSF until proven otherwise and should be treated immediately with doxycycline.  I have sent this rx to your pharmacy to use if you develop these symptoms (along with zofran rx).  Let me know if this occurs so we can set you up for the appropriate testing .   Prednisone taper sent for Prn use (severe poison ivy,  bronchitis etc)   You might want to try using Relaxium for insomnia  (as seen on TV commercials) . It is available through [employer] and contains all natural supplements:  Melatonin 5 mg  Chamomile 25 mg Passionflower extract 75 mg GABA 100 mg Ashwaganda extract 125 mg Magnesium citrate, glycinate, oxide (100 mg)  L tryptophan 500 mg Valerest (proprietary  ingredient ; probably valeria root extract)

## 2023-06-03 NOTE — Progress Notes (Signed)
 Patient ID: Robin Riley, female    DOB: 02/08/1976  Age: 48 y.o. MRN: [id number]  The patient is here for annual preventive examination and management of other chronic and acute problems.   The risk factors are reflected in the social history.   The roster of all physicians providing medical care to patient - is listed in the Snapshot section of the chart.   Activities of daily living:  The patient is 100% independent in all ADLs: dressing, toileting, feeding as well as independent mobility   Home safety : The patient has smoke detectors in the home. They wear seatbelts.  There are no unsecured firearms at home. There is no violence in the home.    There is no risks for hepatitis, STDs or HIV. There is no   history of blood transfusion. They have no travel history to infectious disease endemic areas of the world.   The patient has seen their dentist in the last six month. They have seen their eye doctor in the last year. The patinet  denies slight hearing difficulty with regard to whispered voices and some television programs.  They have deferred audiologic testing in the last year.  They do not  have excessive sun exposure. Discussed the need for sun protection: hats, long sleeves and use of sunscreen if there is significant sun exposure.    Diet: the importance of a healthy diet is discussed. They do have a healthy diet.   The benefits of regular aerobic exercise were discussed. The patient  exercises  3 to 5 days per week  for  60 minutes.    Depression screen: there are no signs or vegative symptoms of depression- irritability, change in appetite, anhedonia, sadness/tearfullness.   The following portions of the patient's history were reviewed and updated as appropriate: allergies, current medications, past family history, past medical history,  past surgical history, past social history  and problem list.   Visual acuity was not assessed per patient preference since the patient has  regular follow up with an  ophthalmologist. Hearing and body mass index were assessed and reviewed.    During the course of the visit the patient was educated and counseled about appropriate screening and preventive services including : fall prevention , diabetes screening, nutrition counseling, colorectal cancer screening, and recommended immunizations.    Chief Complaint:   Anemia noted by GYN during recent evaluation .  She has been amenorrheic for over a year,  and denies any  hnage in stools.      Review of Symptoms  Patient denies headache, fevers, malaise, unintentional weight loss, skin rash, eye pain, sinus congestion and sinus pain, sore throat, dysphagia,  hemoptysis , cough, dyspnea, wheezing, chest pain, palpitations, orthopnea, edema, abdominal pain, nausea, melena, diarrhea, constipation, flank pain, dysuria, hematuria, urinary  Frequency, nocturia, numbness, tingling, seizures,  Focal weakness, Loss of consciousness,  Tremor, insomnia, depression, anxiety, and suicidal ideation.    Physical Exam:  BP 100/78 (BP Location: Left Arm, Patient Position: Sitting, Cuff Size: Normal)   Pulse 85   Temp (!) 97.5 F (36.4 C)   Resp (!) 2   Ht 5\' 3"  (1.6 m)   Wt 125 lb 6.4 oz (56.9 kg)   SpO2 96%   BMI 22.21 kg/m    Physical Exam Vitals reviewed.  Constitutional:      General: She is not in acute distress.    Appearance: Normal appearance. She is normal weight. She is not ill-appearing, toxic-appearing or diaphoretic.  HENT:     Head: Normocephalic.  Eyes:     General: No scleral icterus.       Right eye: No discharge.        Left eye: No discharge.     Conjunctiva/sclera: Conjunctivae normal.  Cardiovascular:     Rate and Rhythm: Normal rate and regular rhythm.     Heart sounds: Normal heart sounds.  Pulmonary:     Effort: Pulmonary effort is normal. No respiratory distress.     Breath sounds: Normal breath sounds.  Musculoskeletal:        General: Normal range of  motion.  Skin:    General: Skin is warm and dry.  Neurological:     General: No focal deficit present.     Mental Status: She is alert and oriented to person, place, and time. Mental status is at baseline.  Psychiatric:        Mood and Affect: Mood normal.        Behavior: Behavior normal.        Thought Content: Thought content normal.        Judgment: Judgment normal.   Assessment and Plan: Encounter for preventive health examination  Hyperlipidemia, unspecified hyperlipidemia type -     Lipid panel -     LDL cholesterol, direct  Impaired fasting glucose -     Comprehensive metabolic panel with GFR -     Hemoglobin A1c  Other fatigue -     CBC with Differential/Platelet  B12 deficiency  Anemia, unspecified type -     Iron, TIBC and Ferritin Panel -     B12 and Folate Panel  Other orders -     Doxycycline Hyclate; Take 1 tablet (100 mg total) by mouth 2 (two) times daily.  Dispense: 14 tablet; Refill: 0 -     predniSONE; Take 6 tablets (60 mg total) by mouth daily for 1 day, THEN 5 tablets (50 mg total) daily for 1 day, THEN 4 tablets (40 mg total) daily for 1 day, THEN 3 tablets (30 mg total) daily for 1 day, THEN 2 tablets (20 mg total) daily for 1 day, THEN 1 tablet (10 mg total) daily for 1 day.  Dispense: 21 tablet; Refill: 0 -     Ondansetron; Take 1 tablet (4 mg total) by mouth every 8 (eight) hours as needed for nausea or vomiting.  Dispense: 20 tablet; Refill: 0 -     Cyanocobalamin; Inject 1 mL (1,000 mcg total) into the muscle once a week. For 4 weeks , then monthly thereafter  Dispense: 4 mL; Refill: 2    No follow-ups on file.  Sherlene Shams, MD

## 2023-06-03 NOTE — Assessment & Plan Note (Signed)

## 2023-06-03 NOTE — Assessment & Plan Note (Signed)
 She has lapsed in her supplementing for several months, but her level is >300   Lab Results  Component Value Date   VITAMINB12 357 06/03/2023

## 2023-06-04 LAB — IRON,TIBC AND FERRITIN PANEL
%SAT: 20 % (ref 16–45)
Ferritin: 27 ng/mL (ref 16–232)
Iron: 67 ug/dL (ref 40–190)
TIBC: 342 ug/dL (ref 250–450)

## 2023-06-04 MED ORDER — CLOBETASOL PROPIONATE 0.05 % EX OINT: 1.0000 | TOPICAL_OINTMENT | Freq: Two times a day (BID) | CUTANEOUS | 2 refills | Status: AC

## 2023-06-06 ENCOUNTER — Encounter: Payer: Self-pay | Admitting: Internal Medicine

## 2023-06-07 ENCOUNTER — Encounter: Payer: Self-pay | Admitting: Internal Medicine

## 2023-06-08 ENCOUNTER — Other Ambulatory Visit: Payer: Self-pay | Admitting: Obstetrics and Gynecology

## 2023-06-08 DIAGNOSIS — Z1231 Encounter for screening mammogram for malignant neoplasm of breast: Secondary | ICD-10-CM

## 2023-06-09 ENCOUNTER — Other Ambulatory Visit: Payer: Self-pay

## 2023-06-10 ENCOUNTER — Other Ambulatory Visit: Payer: Self-pay

## 2023-06-11 ENCOUNTER — Other Ambulatory Visit: Payer: Self-pay

## 2023-07-23 ENCOUNTER — Ambulatory Visit
Admission: RE | Admit: 2023-07-23 | Discharge: 2023-07-23 | Disposition: A | Discharge status: Home / Self Care | Source: Ambulatory Visit | Attending: Obstetrics and Gynecology | Admitting: Obstetrics and Gynecology

## 2023-07-23 DIAGNOSIS — Z1231 Encounter for screening mammogram for malignant neoplasm of breast: Secondary | ICD-10-CM

## 2023-08-10 ENCOUNTER — Ambulatory Visit: Admitting: Plastic Surgery

## 2023-09-14 ENCOUNTER — Ambulatory Visit: Admitting: Plastic Surgery

## 2023-09-14 ENCOUNTER — Encounter: Payer: Self-pay | Admitting: Plastic Surgery

## 2023-09-14 VITALS — BP 108/75 | HR 72

## 2023-09-14 DIAGNOSIS — Z853 Personal history of malignant neoplasm of breast: Secondary | ICD-10-CM | POA: Diagnosis not present

## 2023-09-14 DIAGNOSIS — Z08 Encounter for follow-up examination after completed treatment for malignant neoplasm: Secondary | ICD-10-CM | POA: Diagnosis not present

## 2023-09-14 DIAGNOSIS — T8544XA Capsular contracture of breast implant, initial encounter: Secondary | ICD-10-CM

## 2023-09-14 DIAGNOSIS — Z719 Counseling, unspecified: Secondary | ICD-10-CM

## 2023-09-14 DIAGNOSIS — N651 Disproportion of reconstructed breast: Secondary | ICD-10-CM

## 2023-09-14 NOTE — Progress Notes (Signed)
   Subjective:    Patient ID: Robin Riley, female    DOB: 1975/11/27, 48 y.o.   MRN: [id number]  The patient is a 48 year old female here for a 1 year follow-up on her breast reconstruction.  She had a partial mastectomy in 2017 and then was found to have atypical ductal hyperplasia.  She did not have radiation.  She then had follow-up mammograms and decided to undergo a left mastectomy.  She had an expander placed in April 2019 on the left side.  Then in July 2019 she had a left Mentor smooth round high-profile gel 325 cc implant and a right Mentor smooth round high-profile gel 250 cc implant for symmetry.  Then in February 2024 she had fat grafting to help decrease the rippling and she had not scar contracture excision.  Overall she is doing really well.  There is no areas of concern.  She is soft and the implants appear to be soft as well.      Review of Systems  Constitutional: Negative.   HENT: Negative.    Eyes: Negative.   Respiratory: Negative.    Cardiovascular: Negative.   Gastrointestinal: Negative.   Endocrine: Negative.   Genitourinary: Negative.   Musculoskeletal: Negative.        Objective:   Physical Exam Vitals reviewed.  Constitutional:      Appearance: Normal appearance.  HENT:     Head: Atraumatic.  Cardiovascular:     Rate and Rhythm: Normal rate.     Pulses: Normal pulses.  Pulmonary:     Effort: Pulmonary effort is normal.  Abdominal:     Palpations: Abdomen is soft.  Skin:    General: Skin is warm.     Capillary Refill: Capillary refill takes less than 2 seconds.  Neurological:     Mental Status: She is alert and oriented to person, place, and time.  Psychiatric:        Mood and Affect: Mood normal.        Behavior: Behavior normal.        Thought Content: Thought content normal.        Judgment: Judgment normal.        Assessment & Plan:     ICD-10-CM   1. Breast asymmetry following reconstructive surgery  N65.1     2. Capsular  contracture of breast implant, initial encounter  T85.44XA       Pictures were obtained of the patient and placed in the chart with the patient's or guardian's permission.  Continue with supportive bra and massage.  Will plan to see her back in 1 year no imaging required at this point but certainly something that we can talk about at her next visit.  She is aware that the guidelines are every 3 to 5 years for ultrasound.  Pictures were obtained of the patient and placed in the chart with the patient's or guardian's permission.

## 2023-11-08 ENCOUNTER — Encounter: Payer: Self-pay | Admitting: Internal Medicine

## 2023-11-08 DIAGNOSIS — L700 Acne vulgaris: Secondary | ICD-10-CM

## 2023-11-09 NOTE — Telephone Encounter (Signed)
 Refilled: 03/18/2021 Last OV: 06/03/2023 Next OV: not scheduled  Last refilled in 2023 is it okay to refill?

## 2023-11-10 ENCOUNTER — Other Ambulatory Visit: Payer: Self-pay

## 2023-11-10 MED ORDER — TRETINOIN 0.025 % EX CREA
TOPICAL_CREAM | CUTANEOUS | 1 refills | Status: AC
Start: 2023-11-10 — End: ?
  Filled 2023-11-10: qty 20, 30d supply, fill #0

## 2024-01-11 ENCOUNTER — Telehealth: Admitting: Physician Assistant

## 2024-01-11 DIAGNOSIS — R3989 Other symptoms and signs involving the genitourinary system: Secondary | ICD-10-CM

## 2024-01-11 MED ORDER — CEPHALEXIN 500 MG PO CAPS: 500.0000 mg | ORAL_CAPSULE | Freq: Two times a day (BID) | ORAL | 0 refills | Status: AC

## 2024-01-11 MED ORDER — FLUCONAZOLE 150 MG PO TABS: ORAL_TABLET | ORAL | 0 refills | Status: AC

## 2024-01-11 NOTE — Patient Instructions (Addendum)
 Samule JULIANNA Bristol, thank you for joining Elsie Velma Lunger, PA-C for today's virtual visit.  While this provider is not your primary care provider (PCP), if your PCP is located in our provider database this encounter information will be shared with them immediately following your visit.   A [facility] MyChart account gives you access to today's visit and all your visits, tests, and labs performed at [facility]  click here if you don't have a [facility] MyChart account or go to [email or web address]  Consent: (Patient) CHICQUITA MENDEL provided verbal consent for this virtual visit at the beginning of the encounter.  Current Medications:  Current Outpatient Medications:    cephALEXin  (KEFLEX ) 500 MG capsule, Take 1 capsule (500 mg total) by mouth 2 (two) times daily for 7 days., Disp: 14 capsule, Rfl: 0   Biotin 1 MG CAPS, Take 1 tablet by mouth daily., Disp: , Rfl:    calcium citrate-vitamin D  200-200 MG-UNIT TABS, Take 1 tablet by mouth daily., Disp: , Rfl:    clobetasol  ointment (TEMOVATE ) 0.05 %, Apply 1 Application topically 2 (two) times daily., Disp: 30 g, Rfl: 2   cyanocobalamin  (VITAMIN B12) 1000 MCG/ML injection, Inject 1 mL (1,000 mcg total) into the muscle once a week. For 4 weeks , then monthly thereafter, Disp: 4 mL, Rfl: 2   tretinoin  (RETIN-A ) 0.025 % cream, APPLY TOPICALLY TO AFFECTED AREA(S) AT BEDTIME, Disp: 20 g, Rfl: 1   vitamin C (ASCORBIC ACID) 500 MG tablet, Take 500 mg by mouth daily., Disp: , Rfl:    Medications ordered in this encounter:  Meds ordered this encounter  Medications   cephALEXin  (KEFLEX ) 500 MG capsule    Sig: Take 1 capsule (500 mg total) by mouth 2 (two) times daily for 7 days.    Dispense:  14 capsule    Refill:  0    Supervising Provider:   BLAISE ALEENE KIDD [[id number]]     *If you need refills on other medications prior to your next appointment, please contact your pharmacy*  Follow-Up: Call back or seek an  in-person evaluation if the symptoms worsen or if the condition fails to improve as anticipated.  [facility] [phone]  Other Instructions Your symptoms are consistent with a bladder infection, also called acute cystitis. Please take your antibiotic (Keflex ) as directed until all pills are gone.  Stay very well hydrated.  Consider a daily probiotic (Align, Culturelle, or Activia) to help prevent stomach upset caused by the antibiotic.  Taking a probiotic daily may also help prevent recurrent UTIs.  Also consider taking AZO (Phenazopyridine) tablets to help decrease pain with urination.  If you note any non-resolving, new, or worsening symptoms despite treatment, please seek an in-person evaluation ASAP.   Urinary Tract Infection A urinary tract infection (UTI) can occur any place along the urinary tract. The tract includes the kidneys, ureters, bladder, and urethra. A type of germ called bacteria often causes a UTI. UTIs are often helped with antibiotic medicine.  HOME CARE  If given, take antibiotics as told by your doctor. Finish them even if you start to feel better. Drink enough fluids to keep your pee (urine) clear or pale yellow. Avoid tea, drinks with caffeine, and bubbly (carbonated) drinks. Pee often. Avoid holding your pee in for a long time. Pee before and after having sex (intercourse). Wipe from front to back after you poop (bowel movement) if you are a woman. Use each tissue only once. GET HELP RIGHT AWAY  IF:  You have back pain. You have lower belly (abdominal) pain. You have chills. You feel sick to your stomach (nauseous). You throw up (vomit). Your burning or discomfort with peeing does not go away. You have a fever. Your symptoms are not better in 3 days. MAKE SURE YOU:  Understand these instructions. Will watch your condition. Will get help right away if you are not doing well or get worse. Document Released: 08/05/2007 Document Revised: 11/11/2011  Document Reviewed: 09/17/2011 [facility] Patient Information 2015 [place], [place]. This information is not intended to replace advice given to you by your health care provider. Make sure you discuss any questions you have with your health care provider.    If you have been instructed to have an in-person evaluation today at a local Urgent Care facility, please use the link below. It will take you to a list of all of our available [facility], including address, phone number and hours of operation. Please do not delay care.  [facility]  If you or a family member do not have a primary care provider, use the link below to schedule a visit and establish care. When you choose a [facility] primary care physician or advanced practice provider, you gain a long-term partner in health. Find a Primary Care Provider  Learn more about [facility]'s in-office and virtual care options: [facility] - Get Care Now

## 2024-01-11 NOTE — Progress Notes (Signed)
 Virtual Visit Consent   Robin Riley, you are scheduled for a virtual visit with a [facility] provider today. Just as with appointments in the office, your consent must be obtained to participate. Your consent will be active for this visit and any virtual visit you may have with one of our providers in the next 365 days. If you have a MyChart account, a copy of this consent can be sent to you electronically.  As this is a virtual visit, video technology does not allow for your provider to perform a traditional examination. This may limit your provider's ability to fully assess your condition. If your provider identifies any concerns that need to be evaluated in person or the need to arrange testing (such as labs, EKG, etc.), we will make arrangements to do so. Although advances in technology are sophisticated, we cannot ensure that it will always work on either your end or our end. If the connection with a video visit is poor, the visit may have to be switched to a telephone visit. With either a video or telephone visit, we are not always able to ensure that we have a secure connection.  By engaging in this virtual visit, you consent to the provision of healthcare and authorize for your insurance to be billed (if applicable) for the services provided during this visit. Depending on your insurance coverage, you may receive a charge related to this service.  I need to obtain your verbal consent now. Are you willing to proceed with your visit today? Robin Riley has provided verbal consent on 01/11/2024 for a virtual visit (video or telephone). Elsie Velma Lunger, [place]  Date: 01/11/2024 8:20 AM   Virtual Visit via Video Note   I, Elsie Velma Lunger, connected with  Robin Riley  ([id number], Nov 14, 1975) on 01/11/24 at  8:15 AM EST by a video-enabled telemedicine application and verified that I am speaking with the correct person using two identifiers.  Location: Patient:  Virtual Visit Location Patient: Home Provider: Virtual Visit Location Provider: Home Office   I discussed the limitations of evaluation and management by telemedicine and the availability of in person appointments. The patient expressed understanding and agreed to proceed.    History of Present Illness: Robin Riley is a 48 y.o. who identifies as a female who was assigned female at birth, and is being seen today for possible UTI. Endorses symptoms onset last night with suprapubic pressure, urinary urgency/frequency, dysuria. This morning noting small amount of blood in the urine. Denies fever, chills, flank pain. Some left pelvic and suprapubic pain this morning.  Denies vaginal symptoms. S/p endometrial ablation.  HPI: HPI  Problems:  Patient Active Problem List   Diagnosis Date Noted   Anemia 06/03/2023   Onychomycosis 03/30/2022   Tubular adenoma of colon 06/10/2021   Colon cancer screening    Rectal polyp    Breast asymmetry following reconstructive surgery 05/20/2021   Breast implant capsular contracture 05/20/2021   Acquired absence of breast 04/17/2021   History of reconstruction of left breast 05/10/2018   H/O mastectomy, left 03/19/2018   History of augmentation of right breast 03/19/2018   Divorced 04/21/2017   Atypical ductal hyperplasia of left breast 01/12/2016   Encounter for preventive health examination 01/10/2012   B12 deficiency 01/05/2012   History of vertebral fracture    Screening for cervical cancer 01/06/2011   Adrenal tumor 01/06/2011    Allergies:  Allergies  Allergen Reactions   Sulfa Antibiotics Itching and Rash  Medications:  Current Outpatient Medications:    cephALEXin  (KEFLEX ) 500 MG capsule, Take 1 capsule (500 mg total) by mouth 2 (two) times daily for 7 days., Disp: 14 capsule, Rfl: 0   Biotin 1 MG CAPS, Take 1 tablet by mouth daily., Disp: , Rfl:    calcium citrate-vitamin D  200-200 MG-UNIT TABS, Take 1 tablet by mouth daily., Disp: ,  Rfl:    clobetasol  ointment (TEMOVATE ) 0.05 %, Apply 1 Application topically 2 (two) times daily., Disp: 30 g, Rfl: 2   cyanocobalamin  (VITAMIN B12) 1000 MCG/ML injection, Inject 1 mL (1,000 mcg total) into the muscle once a week. For 4 weeks , then monthly thereafter, Disp: 4 mL, Rfl: 2   tretinoin  (RETIN-A ) 0.025 % cream, APPLY TOPICALLY TO AFFECTED AREA(S) AT BEDTIME, Disp: 20 g, Rfl: 1   vitamin C (ASCORBIC ACID) 500 MG tablet, Take 500 mg by mouth daily., Disp: , Rfl:   Observations/Objective: Patient is well-developed, well-nourished in no acute distress.  Resting comfortably  at home.  Head is normocephalic, atraumatic.  No labored breathing.  Speech is clear and coherent with logical content.  Patient is alert and oriented at baseline.   Assessment and Plan: 1. Suspected UTI (Primary) - cephALEXin  (KEFLEX ) 500 MG capsule; Take 1 capsule (500 mg total) by mouth 2 (two) times daily for 7 days.  Dispense: 14 capsule; Refill: 0  Classic UTI symptoms with absence of alarm signs or symptoms. Prior history of UTI. Will treat empirically with Keflex  for suspected uncomplicated cystitis. Supportive measures and OTC medications reviewed. Strict in-person evaluation precautions discussed.    Follow Up Instructions: I discussed the assessment and treatment plan with the patient. The patient was provided an opportunity to ask questions and all were answered. The patient agreed with the plan and demonstrated an understanding of the instructions.  A copy of instructions were sent to the patient via MyChart unless otherwise noted below.   The patient was advised to call back or seek an in-person evaluation if the symptoms worsen or if the condition fails to improve as anticipated.    Elsie Velma Lunger, PA-C

## 2024-02-03 ENCOUNTER — Other Ambulatory Visit: Payer: Self-pay | Admitting: Obstetrics and Gynecology

## 2024-02-03 DIAGNOSIS — Z1231 Encounter for screening mammogram for malignant neoplasm of breast: Secondary | ICD-10-CM

## 2024-06-05 ENCOUNTER — Encounter: Admitting: Internal Medicine

## 2024-07-25 ENCOUNTER — Ambulatory Visit

## 2024-09-15 ENCOUNTER — Ambulatory Visit: Admitting: Plastic Surgery
